# Patient Record
Sex: Male | Born: 1970 | Race: Black or African American | Marital: Single
Health system: Southern US, Community
[De-identification: ages and names within clinical notes are randomized; demographics above are authoritative.]

## PROBLEM LIST (undated history)

## (undated) DIAGNOSIS — F32A Depression, unspecified: Secondary | ICD-10-CM

## (undated) DIAGNOSIS — F319 Bipolar disorder, unspecified: Secondary | ICD-10-CM

## (undated) DIAGNOSIS — K219 Gastro-esophageal reflux disease without esophagitis: Secondary | ICD-10-CM

## (undated) DIAGNOSIS — A6000 Herpesviral infection of urogenital system, unspecified: Secondary | ICD-10-CM

## (undated) DIAGNOSIS — Z8659 Personal history of other mental and behavioral disorders: Secondary | ICD-10-CM

## (undated) DIAGNOSIS — F1910 Other psychoactive substance abuse, uncomplicated: Secondary | ICD-10-CM

## (undated) DIAGNOSIS — F329 Major depressive disorder, single episode, unspecified: Secondary | ICD-10-CM

## (undated) DIAGNOSIS — F1491 Cocaine use, unspecified, in remission: Secondary | ICD-10-CM

## (undated) HISTORY — PX: NECK SURGERY: SHX720

---

## 2004-09-30 ENCOUNTER — Emergency Department: Payer: Self-pay | Admitting: Emergency Medicine

## 2005-04-09 ENCOUNTER — Emergency Department: Payer: Self-pay | Admitting: Emergency Medicine

## 2007-08-22 ENCOUNTER — Emergency Department: Payer: Self-pay | Admitting: Internal Medicine

## 2012-12-09 ENCOUNTER — Emergency Department: Payer: Self-pay | Admitting: Emergency Medicine

## 2012-12-09 LAB — COMPREHENSIVE METABOLIC PANEL
Albumin: 3.9 g/dL (ref 3.4–5.0)
Anion Gap: 5 — ABNORMAL LOW (ref 7–16)
BUN: 11 mg/dL (ref 7–18)
Calcium, Total: 9.1 mg/dL (ref 8.5–10.1)
Creatinine: 1.37 mg/dL — ABNORMAL HIGH (ref 0.60–1.30)
EGFR (Non-African Amer.): >60
Glucose: 105 mg/dL — ABNORMAL HIGH (ref 65–99)
Potassium: 3.6 mmol/L (ref 3.5–5.1)
SGPT (ALT): 29 U/L (ref 12–78)
Sodium: 137 mmol/L (ref 136–145)

## 2012-12-09 LAB — URINALYSIS, COMPLETE
Bilirubin,UR: NEGATIVE
Blood: NEGATIVE
Glucose,UR: NEGATIVE mg/dL (ref 0–75)
Ketone: NEGATIVE
Nitrite: NEGATIVE
Ph: 6 (ref 4.5–8.0)
Protein: NEGATIVE
WBC UR: 2 /HPF (ref 0–5)

## 2012-12-09 LAB — CBC
HGB: 13.8 g/dL (ref 13.0–18.0)
MCH: 29.9 pg (ref 26.0–34.0)
MCHC: 33.2 g/dL (ref 32.0–36.0)
MCV: 90 fL (ref 80–100)
Platelet: 262 10*3/uL (ref 150–440)
RBC: 4.6 10*6/uL (ref 4.40–5.90)

## 2012-12-09 LAB — TROPONIN I: Troponin-I: <0.02 ng/mL

## 2013-09-12 ENCOUNTER — Emergency Department: Payer: Self-pay | Admitting: Emergency Medicine

## 2013-09-12 LAB — COMPREHENSIVE METABOLIC PANEL
ALBUMIN: 4.3 g/dL (ref 3.4–5.0)
AST: 43 U/L — AB (ref 15–37)
Alkaline Phosphatase: 67 U/L
Anion Gap: 11 (ref 7–16)
BILIRUBIN TOTAL: 0.8 mg/dL (ref 0.2–1.0)
BUN: 11 mg/dL (ref 7–18)
Calcium, Total: 9.4 mg/dL (ref 8.5–10.1)
Chloride: 104 mmol/L (ref 98–107)
Co2: 22 mmol/L (ref 21–32)
Creatinine: 1.65 mg/dL — ABNORMAL HIGH (ref 0.60–1.30)
EGFR (African American): 58 — ABNORMAL LOW
EGFR (Non-African Amer.): 50 — ABNORMAL LOW
GLUCOSE: 78 mg/dL (ref 65–99)
OSMOLALITY: 272 (ref 275–301)
Potassium: 3.8 mmol/L (ref 3.5–5.1)
SGPT (ALT): 19 U/L (ref 12–78)
Sodium: 137 mmol/L (ref 136–145)
Total Protein: 8.1 g/dL (ref 6.4–8.2)

## 2013-09-12 LAB — CBC
HCT: 44.6 % (ref 40.0–52.0)
HGB: 14.4 g/dL (ref 13.0–18.0)
MCH: 29.4 pg (ref 26.0–34.0)
MCHC: 32.2 g/dL (ref 32.0–36.0)
MCV: 91 fL (ref 80–100)
Platelet: 250 10*3/uL (ref 150–440)
RBC: 4.89 10*6/uL (ref 4.40–5.90)
RDW: 13.5 % (ref 11.5–14.5)
WBC: 9.5 10*3/uL (ref 3.8–10.6)

## 2013-09-12 LAB — ETHANOL
Ethanol %: 0.234 % — ABNORMAL HIGH (ref 0.000–0.080)
Ethanol: 234 mg/dL

## 2013-09-12 LAB — SALICYLATE LEVEL: Salicylates, Serum: <1.7 mg/dL

## 2013-09-12 LAB — ACETAMINOPHEN LEVEL: Acetaminophen: <2 ug/mL

## 2013-09-13 LAB — URINALYSIS, COMPLETE
BACTERIA: NONE SEEN
Bilirubin,UR: NEGATIVE
GLUCOSE, UR: NEGATIVE mg/dL (ref 0–75)
Hyaline Cast: 4
KETONE: NEGATIVE
Leukocyte Esterase: NEGATIVE
NITRITE: NEGATIVE
PH: 5 (ref 4.5–8.0)
Protein: 30
Specific Gravity: 1.008 (ref 1.003–1.030)
WBC UR: 1 /HPF (ref 0–5)

## 2013-09-13 LAB — DRUG SCREEN, URINE
AMPHETAMINES, UR SCREEN: NEGATIVE (ref ?–1000)
Barbiturates, Ur Screen: NEGATIVE (ref ?–200)
Benzodiazepine, Ur Scrn: NEGATIVE (ref ?–200)
CANNABINOID 50 NG, UR ~~LOC~~: NEGATIVE (ref ?–50)
COCAINE METABOLITE, UR ~~LOC~~: POSITIVE (ref ?–300)
MDMA (ECSTASY) UR SCREEN: NEGATIVE (ref ?–500)
METHADONE, UR SCREEN: NEGATIVE (ref ?–300)
OPIATE, UR SCREEN: NEGATIVE (ref ?–300)
Phencyclidine (PCP) Ur S: NEGATIVE (ref ?–25)
TRICYCLIC, UR SCREEN: NEGATIVE (ref ?–1000)

## 2013-10-02 ENCOUNTER — Emergency Department: Payer: Self-pay

## 2013-10-02 LAB — URINALYSIS, COMPLETE
BLOOD: NEGATIVE
Bilirubin,UR: NEGATIVE
Glucose,UR: NEGATIVE mg/dL (ref 0–75)
Ketone: NEGATIVE
LEUKOCYTE ESTERASE: NEGATIVE
NITRITE: NEGATIVE
PH: 5 (ref 4.5–8.0)
PROTEIN: NEGATIVE
SPECIFIC GRAVITY: 1.026 (ref 1.003–1.030)
WBC UR: 1 /HPF (ref 0–5)

## 2013-10-02 LAB — DRUG SCREEN, URINE
AMPHETAMINES, UR SCREEN: NEGATIVE (ref ?–1000)
Barbiturates, Ur Screen: NEGATIVE (ref ?–200)
Benzodiazepine, Ur Scrn: NEGATIVE (ref ?–200)
CANNABINOID 50 NG, UR ~~LOC~~: POSITIVE (ref ?–50)
Cocaine Metabolite,Ur ~~LOC~~: POSITIVE (ref ?–300)
MDMA (ECSTASY) UR SCREEN: NEGATIVE (ref ?–500)
METHADONE, UR SCREEN: NEGATIVE (ref ?–300)
Opiate, Ur Screen: NEGATIVE (ref ?–300)
Phencyclidine (PCP) Ur S: NEGATIVE (ref ?–25)
TRICYCLIC, UR SCREEN: NEGATIVE (ref ?–1000)

## 2013-10-02 LAB — CBC
HCT: 43.6 % (ref 40.0–52.0)
HGB: 14.1 g/dL (ref 13.0–18.0)
MCH: 29.6 pg (ref 26.0–34.0)
MCHC: 32.3 g/dL (ref 32.0–36.0)
MCV: 91 fL (ref 80–100)
Platelet: 303 10*3/uL (ref 150–440)
RBC: 4.77 10*6/uL (ref 4.40–5.90)
RDW: 14.1 % (ref 11.5–14.5)
WBC: 6.5 10*3/uL (ref 3.8–10.6)

## 2013-10-02 LAB — COMPREHENSIVE METABOLIC PANEL
ALK PHOS: 65 U/L
ANION GAP: 4 — AB (ref 7–16)
Albumin: 3.9 g/dL (ref 3.4–5.0)
BILIRUBIN TOTAL: 0.7 mg/dL (ref 0.2–1.0)
BUN: 9 mg/dL (ref 7–18)
CALCIUM: 9 mg/dL (ref 8.5–10.1)
Chloride: 106 mmol/L (ref 98–107)
Co2: 30 mmol/L (ref 21–32)
Creatinine: 1.19 mg/dL (ref 0.60–1.30)
EGFR (Non-African Amer.): >60
Glucose: 79 mg/dL (ref 65–99)
OSMOLALITY: 277 (ref 275–301)
POTASSIUM: 3.7 mmol/L (ref 3.5–5.1)
SGOT(AST): 16 U/L (ref 15–37)
SGPT (ALT): 18 U/L (ref 12–78)
Sodium: 140 mmol/L (ref 136–145)
TOTAL PROTEIN: 7.8 g/dL (ref 6.4–8.2)

## 2013-10-02 LAB — ETHANOL: Ethanol %: <0.003 % (ref 0.000–0.080)

## 2013-10-02 LAB — SALICYLATE LEVEL: Salicylates, Serum: <1.7 mg/dL

## 2013-10-02 LAB — TROPONIN I: Troponin-I: <0.02 ng/mL

## 2013-10-02 LAB — ACETAMINOPHEN LEVEL: Acetaminophen: <2 ug/mL

## 2014-03-05 ENCOUNTER — Emergency Department: Payer: Self-pay | Admitting: Emergency Medicine

## 2014-08-18 IMAGING — CT CT HEAD WITHOUT CONTRAST
2 series · 14 of 30 positions shown, 16 images · non-contrast
Comparison: None

CLINICAL DATA: Possible seizure.  Shaking arms and legs.

EXAM:
CT HEAD WITHOUT CONTRAST
TECHNIQUE: Contiguous axial images were obtained from the base of the skull
through the vertex without contrast.

[Series 2: head wo · axial · 0.44mm/px · z∈[-6,+102]mm · 6 of 34 slices shown, 8 images]
[im 5/34  brain]
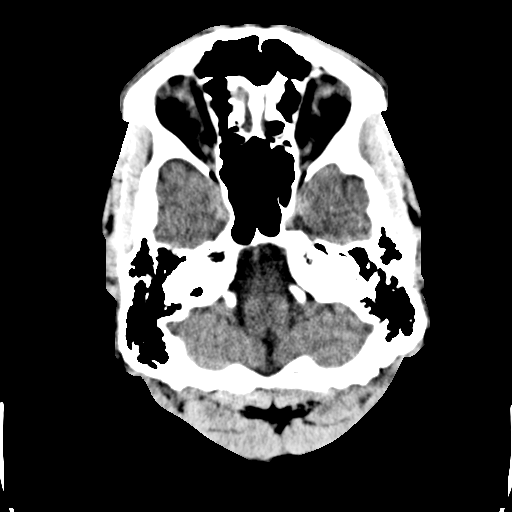
[im 5/34  bone]
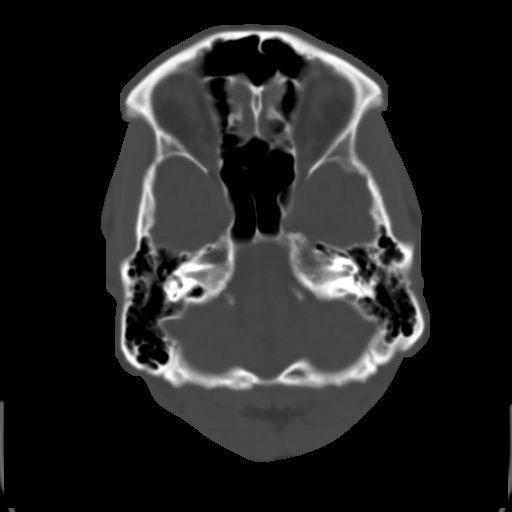
[im 10/34  brain]
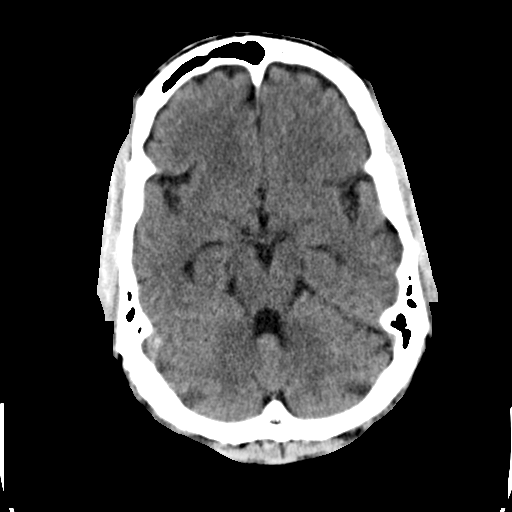
[im 15/34  brain]
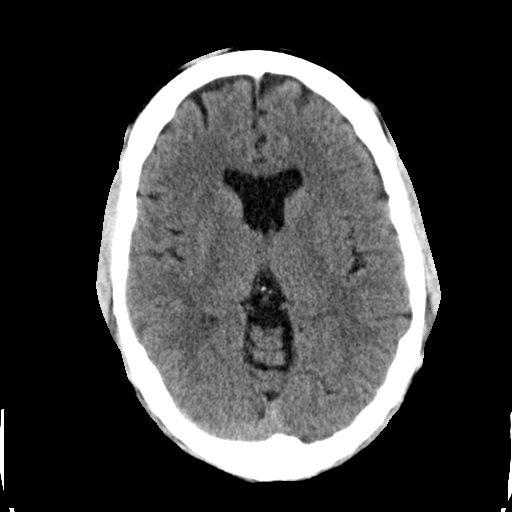
[im 19/34  brain]
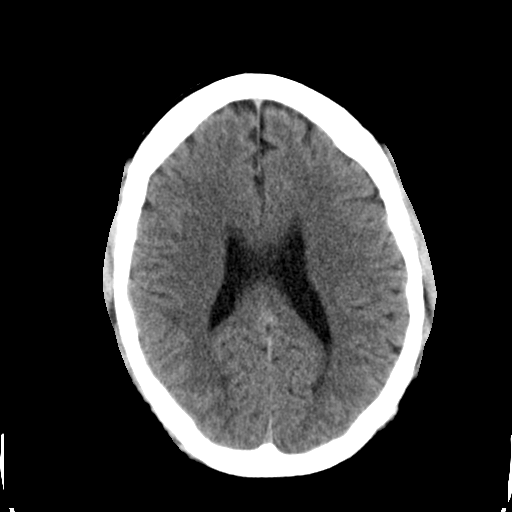
[im 24/34  brain]
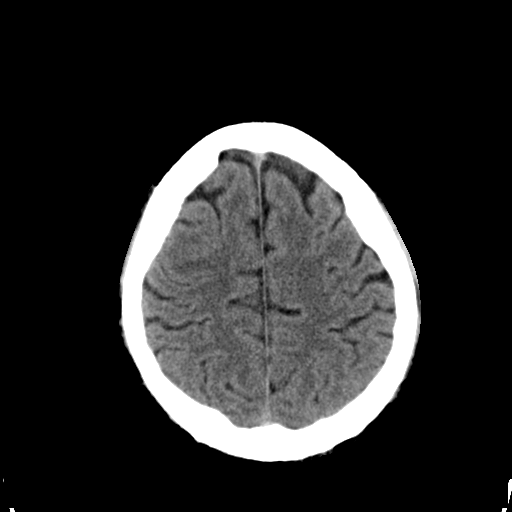
[im 24/34  bone]
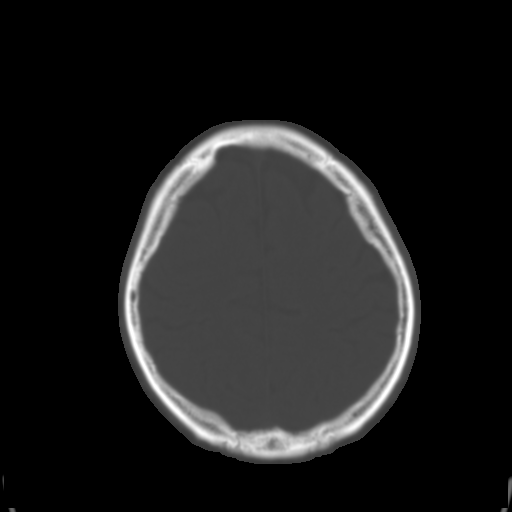
[im 29/34  brain]
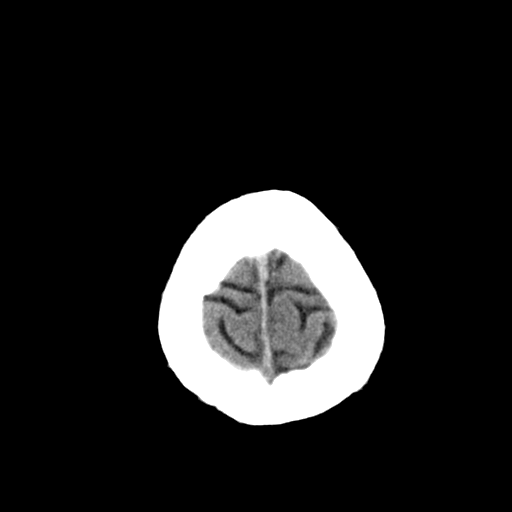

[Series 3: head bone · axial · 0.44mm/px · z∈[-12,+111]mm · 8 of 102 slices shown]
[im 10/102  bone]
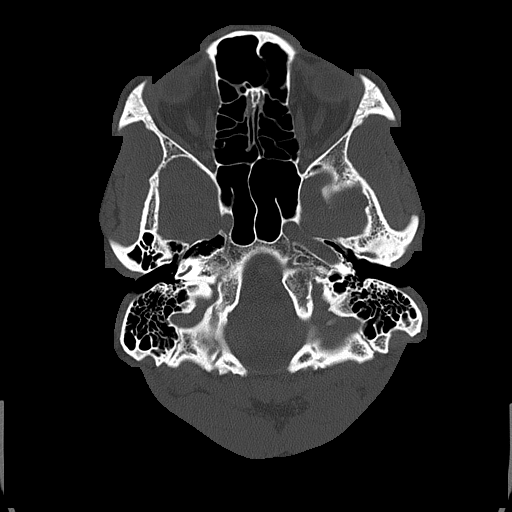
[im 20/102  bone]
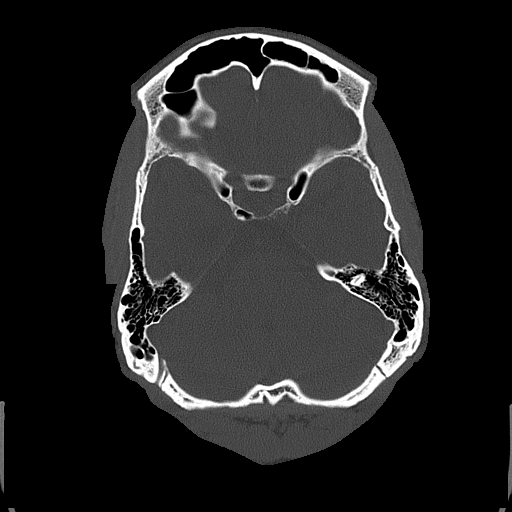
[im 34/102  bone]
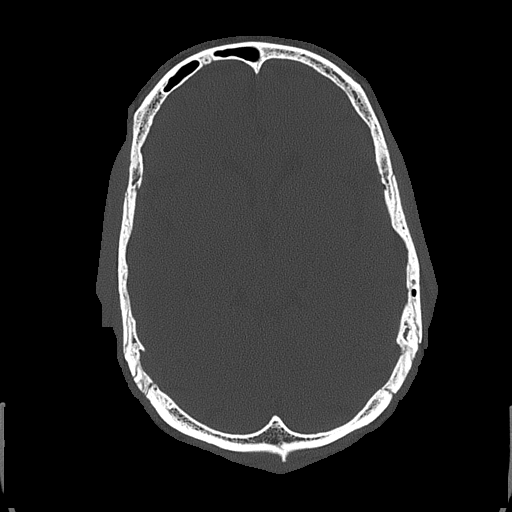
[im 44/102  bone]
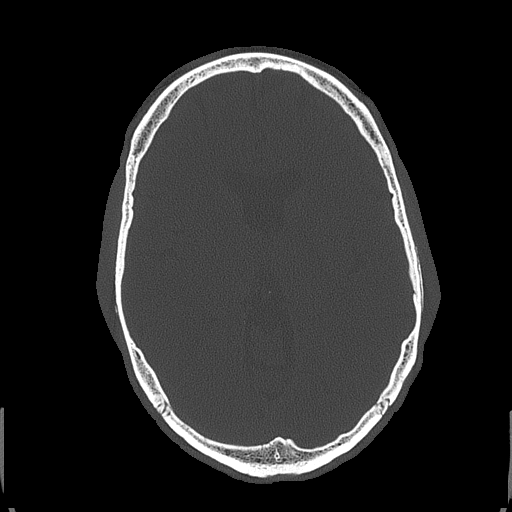
[im 58/102  bone]
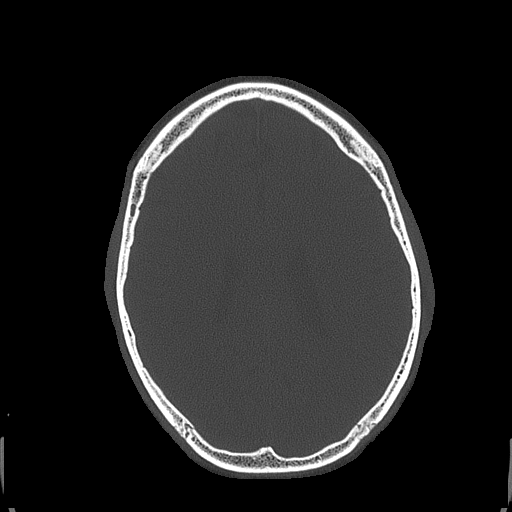
[im 68/102  bone]
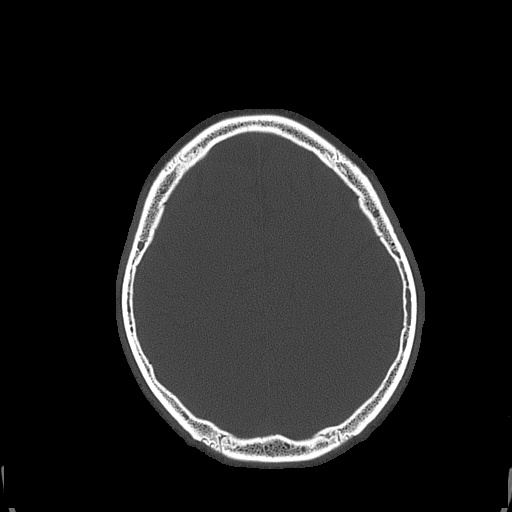
[im 82/102  bone]
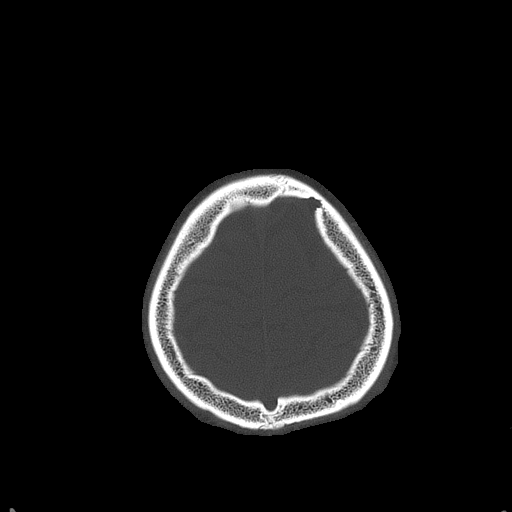
[im 92/102  bone]
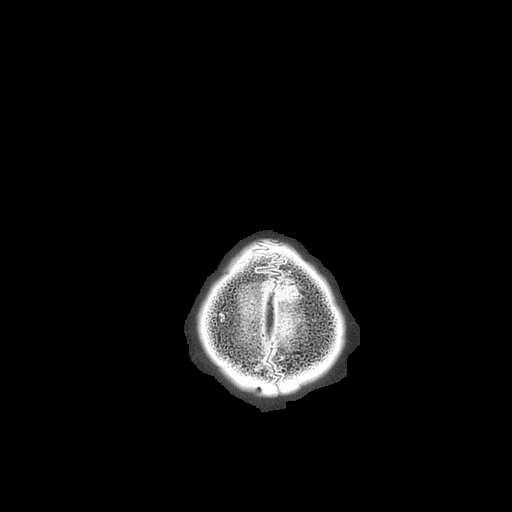

[14 of 30 positions shown; findings below may reference images not displayed]

FINDINGS: Normal appearance of the intracranial structures. No evidence for
acute hemorrhage, mass lesion, midline shift, hydrocephalus or large
infarct. No acute bony abnormality. The visualized sinuses are
clear. Incidental cavum septum pellucidum
IMPRESSION: Negative exam.

## 2015-01-15 ENCOUNTER — Encounter: Payer: Self-pay | Admitting: Emergency Medicine

## 2015-01-15 ENCOUNTER — Emergency Department
Admission: EM | Admit: 2015-01-15 | Discharge: 2015-01-17 | Disposition: A | Discharge status: Other Acute Inpatient Hospital | Attending: Emergency Medicine | Admitting: Emergency Medicine

## 2015-01-15 DIAGNOSIS — Z139 Encounter for screening, unspecified: Secondary | ICD-10-CM

## 2015-01-15 DIAGNOSIS — Z046 Encounter for general psychiatric examination, requested by authority: Secondary | ICD-10-CM | POA: Diagnosis present

## 2015-01-15 DIAGNOSIS — F1410 Cocaine abuse, uncomplicated: Secondary | ICD-10-CM | POA: Insufficient documentation

## 2015-01-15 DIAGNOSIS — F1910 Other psychoactive substance abuse, uncomplicated: Secondary | ICD-10-CM

## 2015-01-15 DIAGNOSIS — F319 Bipolar disorder, unspecified: Secondary | ICD-10-CM | POA: Diagnosis not present

## 2015-01-15 HISTORY — DX: Bipolar disorder, unspecified: F31.9

## 2015-01-15 LAB — CBC
HCT: 46.8 % (ref 40.0–52.0)
HEMOGLOBIN: 15.1 g/dL (ref 13.0–18.0)
MCH: 29.9 pg (ref 26.0–34.0)
MCHC: 32.3 g/dL (ref 32.0–36.0)
MCV: 92.8 fL (ref 80.0–100.0)
Platelets: 236 10*3/uL (ref 150–440)
RBC: 5.04 MIL/uL (ref 4.40–5.90)
RDW: 13.6 % (ref 11.5–14.5)
WBC: 6.9 10*3/uL (ref 3.8–10.6)

## 2015-01-15 LAB — COMPREHENSIVE METABOLIC PANEL
ALT: 12 U/L — AB (ref 17–63)
AST: 30 U/L (ref 15–41)
Albumin: 4.6 g/dL (ref 3.5–5.0)
Alkaline Phosphatase: 46 U/L (ref 38–126)
Anion gap: 4 — ABNORMAL LOW (ref 5–15)
BUN: 7 mg/dL (ref 6–20)
CALCIUM: 8.9 mg/dL (ref 8.9–10.3)
CO2: 27 mmol/L (ref 22–32)
CREATININE: 1.41 mg/dL — AB (ref 0.61–1.24)
Chloride: 110 mmol/L (ref 101–111)
GFR calc Af Amer: >60 mL/min (ref >60)
GFR calc non Af Amer: 59 mL/min — ABNORMAL LOW (ref >60)
GLUCOSE: 72 mg/dL (ref 65–99)
Potassium: 4.1 mmol/L (ref 3.5–5.1)
Sodium: 141 mmol/L (ref 135–145)
Total Bilirubin: 0.8 mg/dL (ref 0.3–1.2)
Total Protein: 8 g/dL (ref 6.5–8.1)

## 2015-01-15 LAB — URINE DRUG SCREEN, QUALITATIVE (ARMC ONLY)
Amphetamines, Ur Screen: NOT DETECTED
Barbiturates, Ur Screen: NOT DETECTED
Benzodiazepine, Ur Scrn: NOT DETECTED
CANNABINOID 50 NG, UR ~~LOC~~: NOT DETECTED
Cocaine Metabolite,Ur ~~LOC~~: POSITIVE — AB
MDMA (ECSTASY) UR SCREEN: NOT DETECTED
METHADONE SCREEN, URINE: NOT DETECTED
Opiate, Ur Screen: NOT DETECTED
PHENCYCLIDINE (PCP) UR S: NOT DETECTED
Tricyclic, Ur Screen: NOT DETECTED

## 2015-01-15 LAB — ETHANOL: Alcohol, Ethyl (B): 163 mg/dL — ABNORMAL HIGH (ref <5)

## 2015-01-15 MED ORDER — DIPHENHYDRAMINE HCL 50 MG/ML IJ SOLN
INTRAMUSCULAR | Status: AC
  Filled 2015-01-15: qty 1

## 2015-01-15 MED ORDER — HALOPERIDOL LACTATE 5 MG/ML IJ SOLN
5.0000 mg | Freq: Once | INTRAMUSCULAR | Status: AC
  Administered 2015-01-15: 5 mg via INTRAMUSCULAR

## 2015-01-15 MED ORDER — DIVALPROEX SODIUM 250 MG PO DR TAB
250.0000 mg | DELAYED_RELEASE_TABLET | Freq: Every day | ORAL | Status: DC
  Administered 2015-01-15 – 2015-01-16 (×2): 250 mg via ORAL
  Filled 2015-01-15 (×2): qty 1

## 2015-01-15 MED ORDER — DIPHENHYDRAMINE HCL 50 MG/ML IJ SOLN
50.0000 mg | Freq: Once | INTRAMUSCULAR | Status: AC
  Administered 2015-01-15: 50 mg via INTRAMUSCULAR

## 2015-01-15 MED ORDER — HALOPERIDOL LACTATE 5 MG/ML IJ SOLN
INTRAMUSCULAR | Status: AC
  Filled 2015-01-15: qty 1

## 2015-01-15 MED ORDER — LORAZEPAM 2 MG/ML IJ SOLN
2.0000 mg | Freq: Once | INTRAMUSCULAR | Status: AC
  Administered 2015-01-15: 2 mg via INTRAMUSCULAR

## 2015-01-15 MED ORDER — LORAZEPAM 2 MG/ML IJ SOLN
INTRAMUSCULAR | Status: AC
  Filled 2015-01-15: qty 1

## 2015-01-15 MED ORDER — RISPERIDONE 1 MG PO TABS
0.5000 mg | ORAL_TABLET | Freq: Every day | ORAL | Status: DC
  Administered 2015-01-15: 0.5 mg via ORAL
  Filled 2015-01-15: qty 1

## 2015-01-15 NOTE — ED Notes (Signed)
BEHAVIORAL HEALTH ROUNDING Patient sleeping: Yes.   Patient alert and oriented: not applicable Behavior appropriate: Yes.  ; If no, describe: n/a Nutrition and fluids offered: No Toileting and hygiene offered: No Sitter present: yes Law enforcement present: Yes  ENVIRONMENTAL ASSESSMENT Potentially harmful objects out of patient reach: Yes.   Personal belongings secured: Yes.   Patient dressed in hospital provided attire only: Yes.   Plastic bags out of patient reach: Yes.   Patient care equipment (cords, cables, call bells, lines, and drains) shortened, removed, or accounted for: Yes.   Equipment and supplies removed from bottom of stretcher: Yes.   Potentially toxic materials out of patient reach: Yes.   Sharps container removed or out of patient reach: Yes.      Patient in Forensic Restraints via [employer]. Distal neuro intact, Cap Refill <3sec, no visible bruising/wounds. 

## 2015-01-15 NOTE — ED Notes (Signed)
Patient assigned to appropriate care area. Patient oriented to unit/care area: Informed that, for their safety, care areas are designed for safety and monitored by security cameras at all times; and visiting hours explained to patient. Patient verbalizes understanding, and verbal contract for safety obtained. 

## 2015-01-15 NOTE — ED Notes (Signed)

## 2015-01-15 NOTE — ED Notes (Signed)
Pt transferred into ED BHU room 2   Patient assigned to appropriate care area. Patient oriented to unit/care area: Informed that, for their safety, care areas are designed for safety and monitored by security cameras at all times; Visiting hours and phone times explained to patient. Patient verbalizes understanding, and verbal contract for safety obtained.     

## 2015-01-15 NOTE — ED Provider Notes (Signed)
[facility] Emergency Department Provider Note  ____________________________________________  Time seen: 11:15 AM  I have reviewed the triage vital signs and the nursing notes.   HISTORY  Chief Complaint Medical Clearance     HPI Charles Garrison is a 44 y.o. male presents in [place] [employer] custody.Per police officers patient was taken to the emergency department for clearance for jail. Officers stated that the nurse at the jail stated that the patient's right pupil was not responding to light. Patient admits to smoking crack cocaine all morning. Patient repetitively voicing suicidal ideation stating "I'm been hang myself if you take me to jail". Patient denies any head injury or any trauma at all. Repetitively stating that "I'm bipolar i need  meds"      Past Medical History  Diagnosis Date  . Bipolar 1 disorder     There are no active problems to display for this patient.   History reviewed. No pertinent past surgical history.  No current outpatient prescriptions on file.  Allergies Review of patient's allergies indicates no known allergies.  History reviewed. No pertinent family history.  Social History History  Substance Use Topics  . Smoking status: Unknown If Ever Smoked  . Smokeless tobacco: Not on file  . Alcohol Use: Not on file    Review of Systems  Constitutional: Negative for fever. Eyes: Negative for visual changes. ENT: Negative for sore throat. Cardiovascular: Negative for chest pain. Respiratory: Negative for shortness of breath. Gastrointestinal: Negative for abdominal pain, vomiting and diarrhea. Genitourinary: Negative for dysuria. Musculoskeletal: Negative for back pain. Skin: Negative for rash. Neurological: Negative for headaches, focal weakness or numbness. Psychiatric:Suicidal Ideation  10-point ROS otherwise negative.  ____________________________________________   PHYSICAL EXAM:  VITAL SIGNS: ED Triage  Vitals  Enc Vitals Group     BP 01/15/15 1107 121/88 mmHg     Pulse Rate 01/15/15 1107 95     Resp 01/15/15 1107 19     Temp 01/15/15 1107 97.8 F (36.6 C)     Temp Source 01/15/15 1107 Oral     SpO2 01/15/15 1107 100 %     Weight 01/15/15 1107 230 lb (104.327 kg)     Height 01/15/15 1107 6\' 3"  (1.905 m)     Head Cir --      Peak Flow --      Pain Score --      Pain Loc --      Pain Edu? --      Excl. in GC? --      Constitutional: Very agitated and occasionally combative with officers Eyes: Conjunctivae are normal. PERRL. Normal extraocular movements. ENT   Head: Normocephalic and atraumatic.   Nose: No congestion/rhinnorhea.   Mouth/Throat: Mucous membranes are moist.   Neck: No stridor. Eyes: Normal bilateral pupillary response to light. Hematological/Lymphatic/Immunilogical: No cervical lymphadenopathy. Cardiovascular: Normal rate, regular rhythm. Normal and symmetric distal pulses are present in all extremities. No murmurs, rubs, or gallops. Respiratory: Normal respiratory effort without tachypnea nor retractions. Breath sounds are clear and equal bilaterally. No wheezes/rales/rhonchi. Gastrointestinal: Soft and nontender. No distention. There is no CVA tenderness. Genitourinary: deferred Musculoskeletal: Nontender with normal range of motion in all extremities. No joint effusions.  No lower extremity tenderness nor edema. Neurologic:  Normal speech and language. No gross focal neurologic deficits are appreciated. Speech is normal.  Skin:  Skin is warm, dry and intact. No rash noted. Psychiatric: Mood and affect are normal. Speech and behavior are normal. Patient exhibits appropriate insight  and judgment.  ____________________________________________    LABS (pertinent positives/negatives)    ____________________________________________   EKG    ____________________________________________     RADIOLOGY    ____________________________________________     INITIAL IMPRESSION / ASSESSMENT AND PLAN / ED COURSE  Pertinent labs & imaging results that were available during my care of the patient were reviewed by me and considered in my medical decision making (see chart for details).  Patient became acutely combative with [facility] police officers threatening to kill himself. As such patient was chemically sedated with Haldol Benadryl and Ativan for the safety of staff and himself.  ____________________________________________   FINAL CLINICAL IMPRESSION(S) / ED DIAGNOSES  Final diagnoses:  Encounter for medical screening examination      Darci Current, MD 01/17/15 2220

## 2015-01-15 NOTE — ED Notes (Addendum)
ENVIRONMENTAL ASSESSMENT  Potentially harmful objects out of patient reach: Yes.  Personal belongings secured: Yes.  Patient dressed in hospital provided attire only: Yes.  Plastic bags out of patient reach: Yes.  Patient care equipment (cords, cables, call bells, lines, and drains) shortened, removed, or accounted for: Yes.  Equipment and supplies removed from bottom of stretcher: Yes.  Potentially toxic materials out of patient reach: Yes.  Sharps container removed or out of patient reach: Yes.   BEHAVIORAL HEALTH ROUNDING  Patient sleeping: No.  Patient alert and oriented: yes  Behavior appropriate: Yes. ; If no, describe:  Nutrition and fluids offered: Yes  Toileting and hygiene offered: Yes  Sitter present: not applicable  Law enforcement present: Yes ODS  ED BHU PLACEMENT JUSTIFICATION  Is the patient under IVC or is there intent for IVC: Yes.  Is the patient medically cleared: Yes.  Is there vacancy in the ED BHU: Yes.  Is the population mix appropriate for patient: Yes.  Is the patient awaiting placement in inpatient or outpatient setting: Yes.  Has the patient had a psychiatric consult: Yes.  Survey of unit performed for contraband, proper placement and condition of furniture, tampering with fixtures in bathroom, shower, and each patient room: Yes. ; Findings: All clear  APPEARANCE/BEHAVIOR  calm, cooperative and adequate rapport can be established  NEURO ASSESSMENT  Orientation: time, place and person  Hallucinations: No.None noted (Hallucinations)  Speech: Normal  Gait: normal  RESPIRATORY ASSESSMENT  WNL  CARDIOVASCULAR ASSESSMENT  WNL  GASTROINTESTINAL ASSESSMENT  WNL  EXTREMITIES  WNL  PLAN OF CARE  Provide calm/safe environment. Vital signs assessed twice daily. ED BHU Assessment once each 12-hour shift. Collaborate with intake RN daily or as condition indicates. Assure the ED provider has rounded once each shift. Provide and encourage hygiene. Provide  redirection as needed. Assess for escalating behavior; address immediately and inform ED provider.  Assess family dynamic and appropriateness for visitation as needed: Yes. ; If necessary, describe findings:  Educate the patient/family about BHU procedures/visitation: Yes. ; If necessary, describe findings: Pt is calm and cooperative at this time. Pt understanding and accepting of unit procedures/rules. Will continue to monitor.  

## 2015-01-15 NOTE — ED Notes (Signed)
Patient in Midwife via [employer]. Distal neuro intact, Cap Refill <3sec, no visible bruising/wounds.

## 2015-01-15 NOTE — ED Notes (Signed)
BEHAVIORAL HEALTH ROUNDING Patient sleeping: Yes.   Patient alert and oriented: not applicable SLEEPING Behavior appropriate: Yes.  ; If no, describe: SLEEPING Nutrition and fluids offered: No SLEEPING Toileting and hygiene offered: NoSLEEPING Sitter present: not applicable Law enforcement present: Yes ODS 

## 2015-01-15 NOTE — ED Notes (Signed)
BEHAVIORAL HEALTH ROUNDING Patient sleeping: Yes.   Patient alert and oriented: not applicable Behavior appropriate: Yes.  ; If no, describe:  Nutrition and fluids offered: No Toileting and hygiene offered: No Sitter present: not applicable Law enforcement present: Yes  

## 2015-01-15 NOTE — ED Notes (Signed)
Patient in Forensic Restraints via [employer]. Distal neuro intact, Cap Refill <3sec, no visible bruising/wounds. 

## 2015-01-15 NOTE — ED Notes (Signed)
BEHAVIORAL HEALTH ROUNDING Patient sleeping: Yes.   Patient alert and oriented: not applicable Behavior appropriate: Yes.  ; If no, describe: n/a Nutrition and fluids offered: No Toileting and hygiene offered: No Sitter present: yes Law enforcement present: Yes  ENVIRONMENTAL ASSESSMENT Potentially harmful objects out of patient reach: Yes.   Personal belongings secured: Yes.   Patient dressed in hospital provided attire only: Yes.   Plastic bags out of patient reach: Yes.   Patient care equipment (cords, cables, call bells, lines, and drains) shortened, removed, or accounted for: Yes.   Equipment and supplies removed from bottom of stretcher: Yes.   Potentially toxic materials out of patient reach: Yes.   Sharps container removed or out of patient reach: Yes.      Patient in Midwife via [employer]. Distal neuro intact, Cap Refill <3sec, no visible bruising/wounds.

## 2015-01-15 NOTE — ED Notes (Signed)
Patient to ED BOB [place] [employer] for jail clearance. Patient stated he "smoked crack all morning" Per officers, patient's pupils were not reacting in jail. Patient is alert and oriented. Patient is able to follow instructions and answer questions. Patient in NAD.

## 2015-01-15 NOTE — ED Notes (Signed)
Called SOC in at [phone]

## 2015-01-15 NOTE — ED Notes (Addendum)
BEHAVIORAL HEALTH ROUNDING Patient sleeping: No. Patient alert and oriented: yes Behavior appropriate: No.; If no, describe: n/a Nutrition and fluids offered: Yes  Toileting and hygiene offered: Yes  Sitter present: yes Law enforcement present: Yes   ENVIRONMENTAL ASSESSMENT Potentially harmful objects out of patient reach: Yes. Personal belongings secured: Yes. Patient dressed in hospital provided attire only: Yes.   Plastic bags out of patient reach: Yes.   Patient care equipment (cords, cables, call bells, lines, and drains) shortened, removed, or accounted for: Yes.   Equipment and supplies removed from bottom of stretcher: Yes.   Potentially toxic materials out of patient reach: Yes.   Sharps container removed or out of patient reach: Yes.

## 2015-01-15 NOTE — ED Notes (Signed)
Patient denies pain and is resting comfortably.  

## 2015-01-15 NOTE — ED Notes (Addendum)
Patient acting out, attempting to get out of handcuffs after repeated redirected. Patient continuing to avoid instructions. Orders to medicate patient given by Manson Passey, MD. Patient in Police custody. Handcuffs present. Skin integrity intact. Pulses present. Capillary refill <3 sec. Respirations equal and unlabored.

## 2015-01-16 MED ORDER — RISPERIDONE 2 MG PO TBDP
2.0000 mg | ORAL_TABLET | Freq: Two times a day (BID) | ORAL | Status: DC
  Administered 2015-01-16 – 2015-01-17 (×2): 2 mg via ORAL
  Filled 2015-01-16 (×3): qty 1

## 2015-01-16 NOTE — ED Notes (Signed)
Pt awakened and told that he will have to eat, if he is going to, because, after the dinner hour, he is expected to bring all the trash in his room to the front.  Pt began eating his pizza.

## 2015-01-16 NOTE — ED Notes (Signed)
Pt has been to the bathroom a couple of times this shift, but most of the time, pt has been sleeping.  His breakfast and lunch remain in his room.  Pt is easily awakened, breathing unlabored.

## 2015-01-16 NOTE — ED Notes (Signed)
BEHAVIORAL HEALTH ROUNDING Patient sleeping: Yes.   Patient alert and oriented: not applicable SLEEPING Behavior appropriate: Yes.  ; If no, describe: SLEEPING Nutrition and fluids offered: No SLEEPING Toileting and hygiene offered: NoSLEEPING Sitter present: not applicable Law enforcement present: Yes ODS 

## 2015-01-16 NOTE — ED Notes (Signed)
BEHAVIORAL HEALTH ROUNDING Patient sleeping: Yes.   Patient alert and oriented: not applicable Behavior appropriate: Yes.  ; If no, describe:  Nutrition and fluids offered: Yes  Toileting and hygiene offered: Yes  Sitter present: not applicable Law enforcement present: Yes  

## 2015-01-16 NOTE — ED Notes (Signed)
BEHAVIORAL HEALTH ROUNDING Patient sleeping: Yes.   Patient alert and oriented: not applicable Behavior appropriate: Yes.  ; If no, describe:   Nutrition and fluids offered: No Toileting and hygiene offered: No Sitter present: no Law enforcement present: Yes  and ODS   ED BHU PLACEMENT JUSTIFICATION Is the patient under IVC or is there intent for IVC: Yes.   Is the patient medically cleared: Yes.   Is there vacancy in the ED BHU: Yes.   Is the population mix appropriate for patient: Yes.   Is the patient awaiting placement in inpatient or outpatient setting: Yes.   Has the patient had a psychiatric consult: Yes.   Survey of unit performed for contraband, proper placement and condition of furniture, tampering with fixtures in bathroom, shower, and each patient room: Yes.  ; Findings: all clear APPEARANCE/BEHAVIOR calm and cooperative NEURO ASSESSMENT Orientation: time, place and person Hallucinations: No.None noted (Hallucinations) Speech: Normal Gait: normal RESPIRATORY ASSESSMENT Normal expansion.  Clear to auscultation.  No rales, rhonchi, or wheezing. CARDIOVASCULAR ASSESSMENT regular rate and rhythm, S1, S2 normal, no murmur, click, rub or gallop GASTROINTESTINAL ASSESSMENT soft, nontender, BS WNL, no r/g EXTREMITIES normal strength, tone, and muscle mass PLAN OF CARE Provide calm/safe environment. Vital signs assessed twice daily. ED BHU Assessment once each 12-hour shift. Collaborate with intake RN daily or as condition indicates. Assure the ED provider has rounded once each shift. Provide and encourage hygiene. Provide redirection as needed. Assess for escalating behavior; address immediately and inform ED provider.  Assess family dynamic and appropriateness for visitation as needed: No.; If necessary, describe findings: not observed Educate the patient/family about BHU procedures/visitation: No.; If necessary, describe findings: NA  

## 2015-01-16 NOTE — ED Notes (Signed)
Patient remains on IVC/Consult complete/
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
## 2015-01-16 NOTE — ED Notes (Signed)
BEHAVIORAL HEALTH ROUNDING Patient sleeping: Yes.   Patient alert and oriented: not applicable Behavior appropriate: Yes.  ; If no, describe:  Nutrition and fluids offered: No Toileting and hygiene offered: No Sitter present: not applicable Law enforcement present: Yes  

## 2015-01-16 NOTE — ED Notes (Signed)
Pt awakened for breakfast, but did not eat yet and has returned to sleep.

## 2015-01-16 NOTE — ED Notes (Signed)
Dinner served.  Pt had dinner put on the chair with his other two meals.  He has returned to sleep.

## 2015-01-16 NOTE — ED Notes (Signed)
BEHAVIORAL HEALTH ROUNDING Patient sleeping: No. Patient alert and oriented: yes Behavior appropriate: Yes.  ; If no, describe:  Nutrition and fluids offered: Yes  Toileting and hygiene offered: Yes  Sitter present: not applicable Law enforcement present: Yes  

## 2015-01-16 NOTE — ED Notes (Signed)
Pt agreed to shower, took supplies, and went into shower; however, when he emerged, the soap was unopened and the towels were dry.  When asked if he had showered, pt said "yes".

## 2015-01-16 NOTE — ED Notes (Addendum)
BEHAVIORAL HEALTH ROUNDING Patient sleeping: Yes.   Patient alert and oriented: not applicable SLEEPING Behavior appropriate: Yes.  ; If no, describe: SLEEPING Nutrition and fluids offered: No SLEEPING Toileting and hygiene offered: NoSLEEPING Sitter present: not applicable Law enforcement present: Yes ODS 

## 2015-01-16 NOTE — ED Notes (Signed)
ED BHU PLACEMENT JUSTIFICATION Is the patient under IVC or is there intent for IVC: Yes.   Is the patient medically cleared: Yes.   Is there vacancy in the ED BHU: Yes.   Is the population mix appropriate for patient: Yes.   Is the patient awaiting placement in inpatient or outpatient setting: Yes.   Has the patient had a psychiatric consult: Yes.   Survey of unit performed for contraband, proper placement and condition of furniture, tampering with fixtures in bathroom, shower, and each patient room: Yes.  ; Findings:  APPEARANCE/BEHAVIOR calm and cooperative NEURO ASSESSMENT Orientation: place and person Hallucinations: No.None noted (Hallucinations) Speech: Normal Gait: normal RESPIRATORY ASSESSMENT Normal expansion.  Clear to auscultation.  No rales, rhonchi, or wheezing. CARDIOVASCULAR ASSESSMENT regular rate and rhythm, S1, S2 normal, no murmur, click, rub or gallop GASTROINTESTINAL ASSESSMENT soft, nontender, BS WNL, no r/g EXTREMITIES normal strength, tone, and muscle mass PLAN OF CARE Provide calm/safe environment. Vital signs assessed twice daily. ED BHU Assessment once each 12-hour shift. Collaborate with intake RN daily or as condition indicates. Assure the ED provider has rounded once each shift. Provide and encourage hygiene. Provide redirection as needed. Assess for escalating behavior; address immediately and inform ED provider.  Assess family dynamic and appropriateness for visitation as needed: Yes.  ; If necessary, describe findings:  Educate the patient/family about BHU procedures/visitation: Yes.  ; If necessary, describe findings:  

## 2015-01-16 NOTE — ED Notes (Signed)

## 2015-01-16 NOTE — ED Notes (Signed)

## 2015-01-17 ENCOUNTER — Inpatient Hospital Stay
Admission: EM | Admit: 2015-01-17 | Discharge: 2015-01-20 | DRG: 881 | Disposition: A | Discharge status: Home / Self Care | Payer: MEDICAID | Source: Intra-hospital | Attending: Psychiatry | Admitting: Psychiatry

## 2015-01-17 DIAGNOSIS — F1410 Cocaine abuse, uncomplicated: Secondary | ICD-10-CM | POA: Diagnosis present

## 2015-01-17 DIAGNOSIS — A6000 Herpesviral infection of urogenital system, unspecified: Secondary | ICD-10-CM | POA: Diagnosis present

## 2015-01-17 DIAGNOSIS — F1220 Cannabis dependence, uncomplicated: Secondary | ICD-10-CM | POA: Diagnosis present

## 2015-01-17 DIAGNOSIS — F419 Anxiety disorder, unspecified: Secondary | ICD-10-CM | POA: Diagnosis present

## 2015-01-17 DIAGNOSIS — R440 Auditory hallucinations: Secondary | ICD-10-CM | POA: Diagnosis present

## 2015-01-17 DIAGNOSIS — Z79899 Other long term (current) drug therapy: Secondary | ICD-10-CM | POA: Diagnosis not present

## 2015-01-17 DIAGNOSIS — G4700 Insomnia, unspecified: Secondary | ICD-10-CM | POA: Diagnosis present

## 2015-01-17 DIAGNOSIS — F1020 Alcohol dependence, uncomplicated: Secondary | ICD-10-CM | POA: Diagnosis present

## 2015-01-17 DIAGNOSIS — R45851 Suicidal ideations: Secondary | ICD-10-CM | POA: Diagnosis present

## 2015-01-17 DIAGNOSIS — F319 Bipolar disorder, unspecified: Secondary | ICD-10-CM | POA: Diagnosis present

## 2015-01-17 DIAGNOSIS — F4321 Adjustment disorder with depressed mood: Secondary | ICD-10-CM | POA: Diagnosis present

## 2015-01-17 DIAGNOSIS — Z87891 Personal history of nicotine dependence: Secondary | ICD-10-CM | POA: Diagnosis not present

## 2015-01-17 DIAGNOSIS — F1590 Other stimulant use, unspecified, uncomplicated: Secondary | ICD-10-CM

## 2015-01-17 DIAGNOSIS — F3130 Bipolar disorder, current episode depressed, mild or moderate severity, unspecified: Secondary | ICD-10-CM

## 2015-01-17 DIAGNOSIS — Z915 Personal history of self-harm: Secondary | ICD-10-CM | POA: Diagnosis not present

## 2015-01-17 LAB — LIPID PANEL
Cholesterol: 237 mg/dL — ABNORMAL HIGH (ref 0–200)
HDL: 64 mg/dL (ref >40)
LDL CALC: 127 mg/dL — AB (ref 0–99)
Total CHOL/HDL Ratio: 3.7 RATIO
Triglycerides: 230 mg/dL — ABNORMAL HIGH (ref <150)
VLDL: 46 mg/dL — ABNORMAL HIGH (ref 0–40)

## 2015-01-17 MED ORDER — ACETAMINOPHEN 325 MG PO TABS: 650.0000 mg | ORAL_TABLET | Freq: Four times a day (QID) | ORAL | Status: DC | PRN

## 2015-01-17 MED ORDER — DIVALPROEX SODIUM 250 MG PO DR TAB
250.0000 mg | DELAYED_RELEASE_TABLET | Freq: Every day | ORAL | Status: DC
  Administered 2015-01-17: 250 mg via ORAL
  Filled 2015-01-17: qty 1

## 2015-01-17 MED ORDER — MAGNESIUM HYDROXIDE 400 MG/5ML PO SUSP: 30.0000 mL | Freq: Every day | ORAL | Status: DC | PRN

## 2015-01-17 MED ORDER — ALUM & MAG HYDROXIDE-SIMETH 200-200-20 MG/5ML PO SUSP: 30.0000 mL | ORAL | Status: DC | PRN

## 2015-01-17 MED ORDER — RISPERIDONE 1 MG PO TBDP
2.0000 mg | ORAL_TABLET | Freq: Two times a day (BID) | ORAL | Status: DC
  Administered 2015-01-17 – 2015-01-18 (×2): 2 mg via ORAL
  Filled 2015-01-17 (×2): qty 2

## 2015-01-17 NOTE — ED Notes (Signed)
BEHAVIORAL HEALTH ROUNDING Patient sleeping: No. Patient alert and oriented: yes Behavior appropriate: Yes.  ; If no, describe:  Nutrition and fluids offered: Yes  Toileting and hygiene offered: Yes  Sitter present: no Law enforcement present: Yes  

## 2015-01-17 NOTE — ED Provider Notes (Signed)
Patient accepted to the inpatient psychiatric service by Dr. Toni Amend. Medically stable.  Emily Filbert, MD 01/17/15 [phone]

## 2015-01-17 NOTE — ED Notes (Signed)
,  BEHAVIORAL HEALTH ROUNDING Patient sleeping: No. Patient alert and oriented: yes Behavior appropriate: Yes.  ; If no, describe:  Nutrition and fluids offered: Yes  Toileting and hygiene offered: Yes  Sitter present: no Law enforcement present: Yes   Awaiting magistrate to come and interview pt.

## 2015-01-17 NOTE — ED Notes (Signed)
BEHAVIORAL HEALTH ROUNDING Patient sleeping: Yes.   Patient alert and oriented: not applicable Behavior appropriate: Yes.  ; If no, describe:   Nutrition and fluids offered: No Toileting and hygiene offered: No Sitter present: no Law enforcement present: Yes  and ODS    
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
## 2015-01-17 NOTE — ED Notes (Addendum)
BEHAVIORAL HEALTH ROUNDING Patient sleeping: Yes.   Patient alert and oriented: not applicable Behavior appropriate: Yes.  ; If no, describe:   Nutrition and fluids offered: No Toileting and hygiene offered: No Sitter present: no Law enforcement present: Yes  and ODS    

## 2015-01-17 NOTE — ED Notes (Signed)
Per [employer], pt will not be seen by magistrate today. Magistrate requests that pt be revaluated by psych for further assessment of in pt tx or if pt can be released to go to jail.  edp  Notified of request.

## 2015-01-17 NOTE — ED Notes (Signed)
BEHAVIORAL HEALTH ROUNDING Patient sleeping: Yes.   Patient alert and oriented: yes Behavior appropriate: Yes.  ; If no, describe:  Nutrition and fluids offered: Yes  Toileting and hygiene offered: Yes  Sitter present: no Law enforcement present: Yes  

## 2015-01-17 NOTE — ED Notes (Signed)
BEHAVIORAL HEALTH ROUNDING Patient sleeping: No. Patient alert and oriented: yes Behavior appropriate: Yes.  ; If no, describe:  Nutrition and fluids offered: Yes  Toileting and hygiene offered: Yes  Sitter present: no Law enforcement present: Yes    New order for psych consult entered.
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
## 2015-01-17 NOTE — ED Notes (Signed)
BEHAVIORAL HEALTH ROUNDING Patient sleeping: No. Patient alert and oriented: yes Behavior appropriate: Yes.  ; If no, describe:  Nutrition and fluids offered: Yes  Toileting and hygiene offered: Yes  Sitter present: no Law enforcement present: Yes   Awaiting admission to be completed.

## 2015-01-17 NOTE — ED Notes (Signed)
[employer] to go through pt belongings to obtain original release date from jail. magristrate to come and interview pt today at app 10am.

## 2015-01-17 NOTE — ED Notes (Signed)
Psych consult completed, pt to be admitted for inpatient treatment, app 2-5 days. [employer] notified to make magistrate aware of pt status.

## 2015-01-17 NOTE — Progress Notes (Signed)
Patient searched with no contraband found

## 2015-01-18 DIAGNOSIS — F1590 Other stimulant use, unspecified, uncomplicated: Secondary | ICD-10-CM

## 2015-01-18 DIAGNOSIS — A6000 Herpesviral infection of urogenital system, unspecified: Secondary | ICD-10-CM

## 2015-01-18 DIAGNOSIS — F1220 Cannabis dependence, uncomplicated: Secondary | ICD-10-CM

## 2015-01-18 DIAGNOSIS — F1020 Alcohol dependence, uncomplicated: Secondary | ICD-10-CM

## 2015-01-18 DIAGNOSIS — F4321 Adjustment disorder with depressed mood: Secondary | ICD-10-CM

## 2015-01-18 LAB — HEMOGLOBIN A1C: Hgb A1c MFr Bld: 5.1 % (ref 4.0–6.0)

## 2015-01-18 MED ORDER — TRAZODONE HCL 50 MG PO TABS: 50.0000 mg | ORAL_TABLET | Freq: Every evening | ORAL | Status: DC | PRN

## 2015-01-18 MED ORDER — VALACYCLOVIR HCL 500 MG PO TABS
500.0000 mg | ORAL_TABLET | Freq: Every day | ORAL | Status: DC
  Administered 2015-01-18 – 2015-01-20 (×3): 500 mg via ORAL
  Filled 2015-01-18 (×3): qty 1

## 2015-01-18 MED ORDER — FLUOXETINE HCL 10 MG PO CAPS
10.0000 mg | ORAL_CAPSULE | Freq: Every day | ORAL | Status: DC
  Administered 2015-01-18 – 2015-01-20 (×3): 10 mg via ORAL
  Filled 2015-01-18 (×3): qty 1

## 2015-01-18 NOTE — BHH Suicide Risk Assessment (Signed)
[facility] Admission Suicide Risk Assessment   Nursing information obtained from:  Patient Demographic factors:  Male Current Mental Status:    Loss Factors:  Loss of significant relationship, Legal issues Historical Factors:  Prior suicide attempts, Family history of mental illness or substance abuse Risk Reduction Factors:  Responsible for children under 45 years of age, Employed, Living with another person, especially a relative, Sense of responsibility to family Total Time spent with patient: 1 hour Principal Problem: Adjustment disorder with depressed mood Diagnosis:   Patient Active Problem List   Diagnosis Date Noted  . Alcohol use disorder, severe, dependence [F10.20] 01/18/2015  . Stimulant use disorder (cocaine) [F15.99] 01/18/2015  . Cannabis use disorder, moderate, dependence [F12.20] 01/18/2015  . Genital herpes [A60.00] 01/18/2015  . Adjustment disorder with depressed mood [F43.21] 01/18/2015     Continued Clinical Symptoms:  Alcohol Use Disorder Identification Test Final Score (AUDIT): 1 The "Alcohol Use Disorders Identification Test", Guidelines for Use in Primary Care, Second Edition.  World Science writer [facility]). Score between 0-7:  no or low risk or alcohol related problems. Score between 8-15:  moderate risk of alcohol related problems. Score between 16-19:  high risk of alcohol related problems. Score 20 or above:  warrants further diagnostic evaluation for alcohol dependence and treatment.   CLINICAL FACTORS:   Depression:   Comorbid alcohol abuse/dependence Alcohol/Substance Abuse/Dependencies Previous Psychiatric Diagnoses and Treatments    Psychiatric Specialty Exam: Physical Exam  ROS    COGNITIVE FEATURES THAT CONTRIBUTE TO RISK:  Closed-mindedness    SUICIDE RISK:   Mild:  Suicidal ideation of limited frequency, intensity, duration, and specificity.  There are no identifiable plans, no associated intent, mild dysphoria and related symptoms, good  self-control (both objective and subjective assessment), few other risk factors, and identifiable protective factors, including available and accessible social support.  PLAN OF CARE: admit to [facility]  Medical Decision Making:  Established Problem, Worsening (2)  I certify that inpatient services furnished can reasonably be expected to improve the patient's condition.   Charles Garrison 01/18/2015, 2:20 PM
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
## 2015-01-18 NOTE — Plan of Care (Signed)
Problem: Alteration in mood Goal: LTG-Patient reports reduction in suicidal thoughts (Patient reports reduction in suicidal thoughts and is able to verbalize a safety plan for whenever patient is feeling suicidal)  Outcome: Progressing Denies suicidal ideations     

## 2015-01-18 NOTE — Progress Notes (Signed)
D: Patient stated slept fair last night .Stated appetite is fair and energy level  Is normal. Stated concentration is good . Stated on Depression scale 0, hopeless 0 and anxiety0 . ( low 0-10 high) Denies suicidal  homicidal ideations  .  No auditory hallucinations  No pain concerns . Appropriate ADL'S. Interacting with peers and staff.  A: Encourage patient participation with unit programming . Instruction  Given on  Medication , verbalize understanding.Instructed on Treatment Team  Aware of length Of Stay R: Voice no other concerns. Staff continue to monitor

## 2015-01-18 NOTE — Tx Team (Signed)
Initial Interdisciplinary Treatment Plan   PATIENT STRESSORS: Legal issue Marital or family conflict   PATIENT STRENGTHS: Ability for insight Financial means General fund of knowledge   PROBLEM LIST: Problem List/Patient Goals Date to be addressed Date deferred Reason deferred Estimated date of resolution  Depression 01/17/15     Suicidal Ideation 01/17/15                                                DISCHARGE CRITERIA:  Improved stabilization in mood, thinking, and/or behavior  PRELIMINARY DISCHARGE PLAN: Outpatient therapy  PATIENT/FAMIILY INVOLVEMENT: This treatment plan has been presented to and reviewed with the patient, JAQUORI HUNSINGER, and/or family member.  The patient and family have been given the opportunity to ask questions and make suggestions.  Gretta [facility] 01/18/2015, 1:23 AM

## 2015-01-18 NOTE — Progress Notes (Signed)
Recreation Therapy Notes  Date: 06.21.16 Time: 3:00 pm Location: Craft Room  Group Topic: Goal Setting   Goal Area(s) Addresses:  Patient will be able to identify one goal. Patient will verbalize benefit of setting goals. Patient will be able to identify at least one positive statement.  Behavioral Response: Attentive  Intervention: Step By Step  Activity: Patients were given a worksheet with a foot on it. Patients were instructed to write a goal on the inside of the foot and to write positive statements/advice on the outside of the foot.  Education: LRT educated patients on healthy ways to celebrate achieving their goals.   Education Outcome: Acknowledges education/In group clarification offered  Clinical Observations/Feedback: Patient completed activity by listing a goal and some positive words. Patient did not contribute to group discussion.  Jacquelynn Cree, LRT/CTRS 01/18/2015 4:06 PM
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
## 2015-01-18 NOTE — Plan of Care (Signed)
Problem: Diagnosis: Increased Risk For Suicide Attempt Goal: LTG-Patient Will Show Positive Response to Medication LTG (by discharge) : Patient will show positive response to medication and will participate in the development of the discharge plan.  Outcome: Progressing Instruction given on prozac, verbalize  Understanding .

## 2015-01-18 NOTE — Progress Notes (Signed)
Recreation Therapy Notes  INPATIENT RECREATION THERAPY ASSESSMENT  Patient Details Name: Charles Garrison MRN: [id number] DOB: 1971/06/06 Today's Date: 01/18/2015  Patient Stressors: Relationship, Work (lack of work)  Coping Skills:   Isolate, Arguments, Substance Abuse, Avoidance, Exercise, Art/Dance, Sports, Music, Talking  Personal Challenges: Stress Management, Anger, Communication, Concentration, Decision-Making, Problem-Solving, Relationships, Self-Esteem/Confidence, Social Interaction, Substance Abuse, Time Management, Trusting Others  Leisure Interests (2+):  Individual - Other (Comment) (Watch sports, dance)  Awareness of Community Resources:  Yes  Community Resources:  [place], [place]  Current Use: Yes  If no, Barriers?:    Patient Strengths:  Alive, trying to get himself back on the right track  Patient Identified Areas of Improvement:  Stress and bipolar  Current Recreation Participation:  Nothing  Patient Goal for Hospitalization:  To get back right  [place] of Residence:  [place] of Residence:  [place]   Current SI (including self-harm):  Yes  Current HI:  No  Consent to Intern Participation: N/A   Jacquelynn Cree, LRT/CTRS 01/18/2015, 1:43 PM

## 2015-01-18 NOTE — [facility] Group Notes (Signed)
[facility] Group Notes:  (Nursing/MHT/Case Management/Adjunct)  Date:  01/18/2015  Time:  3:11 PM  Type of Therapy:  Psychoeducational Skills  Participation Level:  Minimal  Participation Quality:  Appropriate  Affect:  Flat  Cognitive:  Appropriate  Insight:  Improving  Engagement in Group:  None  Modes of Intervention:  Support  Summary of Progress/Problems:  Charles Garrison 01/18/2015, 3:11 PM
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
## 2015-01-18 NOTE — Clinical Social Work Note (Signed)
CSW attempted to contact pt's friend Georgetta Haber [phone] in order to provide suicide education .  CSW left a voicemail message requesting a return call.  CSW will continue to follow and attempt to contact pt's friend.    [place], [place] [phone]

## 2015-01-18 NOTE — [facility] Group Notes (Signed)
[facility] Group Notes:  (Nursing/MHT/Case Management/Adjunct)  Date:  01/18/2015  Time:  10:26 PM  Type of Therapy:  Group Therapy  Participation Level:  Active  Participation Quality:  Appropriate  Affect:  Appropriate  Cognitive:  Alert  Insight:  Appropriate  Engagement in Group:  Engaged  Modes of Intervention:  n/a  Summary of Progress/Problems:  Charles Garrison 01/18/2015, 10:26 PM

## 2015-01-19 MED ORDER — HYDROXYZINE HCL 50 MG PO TABS
50.0000 mg | ORAL_TABLET | Freq: Three times a day (TID) | ORAL | Status: DC
  Administered 2015-01-19 – 2015-01-20 (×3): 50 mg via ORAL
  Filled 2015-01-19 (×3): qty 1

## 2015-01-19 NOTE — Plan of Care (Signed)
Problem: [facility] Participation in Recreation Therapeutic Interventions Goal: STG-Patient will demonstrate improved self esteem by identif STG: Self-Esteem - Within 5 treatment sessions, patient will verbalize at least 5 positive affirmation statements in each of 3 treatment sessions to increase self-esteem post d/c.  Outcome: Progressing Treatment Session 1; Completed 1 out of 3: At approximately 11:20 am, LRT met with patient in craft room. Patient verbalized 5 positive affirmation statements. Patient reported it felt "pretty good". LRT encouraged patient to continue saying positive affirmation statements.  Leonette Monarch, LRT/CTRS 06.22.16 1:39 pm Goal: STG-Patient will identify at least five coping skills for ** STG: Coping Skills - Within 4 treatment sessions, patient will verbalize at least 5 coping skills for substance abuse in each of 2 treatment sessions to decrease substance abuse post d/c.  Outcome: Progressing Treatment Session 1; Completed 1 out of 1: At approximately 11:20 am, LRT met with patient in craft room. Patient verbalized 5 coping skills for substance abuse. LRT educated patient on leisure and why it is important to implement it into his schedule. LRT provided patient with blank schedules to help him plan his day and try avoid using substances. Patient verbalized understanding.  Leonette Monarch, LRT/CTRS 06.22.16 1:44 pm Goal: STG-Other Recreation Therapy Goal (Specify) STG: Stress Management - Within 5 treatment sessions, patient will demonstrate at least one stress management technique in each of 2 treatment sessions to increase stress management skills post d/c.  Outcome: Progressing Treatment Session 1; Completed 0 out of 2: At approximately 11:20 am, LRT met with patient in craft room. LRT educated and provided patient with handouts on stress management techniques. Patient verbalized understanding. LRT encouraged patient to read over and practice the stress management  techniques.  Leonette Monarch, LRT/CTRS 06.22.16 1:46 pm
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
## 2015-01-19 NOTE — [facility] Group Notes (Signed)
[facility] Group Notes:  (Nursing/MHT/Case Management/Adjunct)  Date:  01/19/2015  Time:  11:44 AM  Type of Therapy:  Psychoeducational Skills  Participation Level:  None left early     Charles Garrison 01/19/2015, 11:44 AM

## 2015-01-19 NOTE — Plan of Care (Signed)
Problem: Ineffective individual coping Goal: LTG: Patient will report a decrease in negative feelings Outcome: Progressing Verbalize use of coping skills

## 2015-01-19 NOTE — Progress Notes (Signed)
D: Patient denies SI/HI/AVH. Patient affect is flat. Mood is depressed.  Patient did attend evening group. Patient visible on the milieu. No distress noted. A: Support and encouragement offered. Scheduled medications given to pt. Q 15 min checks continued for patient safety. R: Patient receptive. Patient remains safe on the unit.   

## 2015-01-19 NOTE — Progress Notes (Addendum)
D: Patient stated slept fair last night .Stated appetite is fair and energy level  Is normal. Stated concentration is good . Stated on Depression scale 0, hopeless0 and anxiety 0 .( low 0-10 high) Denies suicidal  homicidal ideations .  No auditory hallucinations  No pain concerns . Appropriate ADL'S. Interacting with peers and staff. Encourage patient to work on Pharmacologist around his anger. Stated he was going to apologize  To his ex girl friend. Noted to blame  A: Encourage patient participation with unit programming . Instruction  Given on  Medication , verbalize understanding. Encourage patient to see his part in the situation. R: Voice no other concerns. Staff continue to monitor

## 2015-01-19 NOTE — Progress Notes (Signed)
Recreation Therapy Notes  Date: 06.22.16 Time: 3:00 pm Location: Craft Room  Group Topic: Self-esteem  Goal Area(s) Addresses:  Patient will be able to identify benefit of self-esteem. Patient will be able to identify ways to increase self-esteem.  Behavioral Response: Attentive, Interactive  Intervention: Self-Portrait  Activity: Patients were instructed to draw their self-portrait, write something positive about themselves and their peers, and draw their self-portrait after they read the positive things peers wrote.   Education:LRT educated patients on ways to increase their self-esteem.   Education Outcome: Acknowledges education/In group clarification offered  Clinical Observations/Feedback: Patient both self-portraits, wrote a positive trait about himself and his peers. Patient contributed to group discussion by stating how he felt after reading the positive comments from his peers.  Jacquelynn Cree, LRT/CTRS 01/19/2015 4:31 PM

## 2015-01-20 MED ORDER — FLUOXETINE HCL 10 MG PO CAPS: 10.0000 mg | ORAL_CAPSULE | Freq: Every day | ORAL | Status: DC

## 2015-01-20 MED ORDER — HYDROXYZINE HCL 50 MG PO TABS: 50.0000 mg | ORAL_TABLET | Freq: Three times a day (TID) | ORAL | Status: DC | PRN

## 2015-01-20 MED ORDER — TRAZODONE HCL 50 MG PO TABS: 50.0000 mg | ORAL_TABLET | Freq: Every evening | ORAL | Status: DC | PRN

## 2015-01-20 MED ORDER — VALACYCLOVIR HCL 500 MG PO TABS: 500.0000 mg | ORAL_TABLET | Freq: Every day | ORAL | Status: DC

## 2015-01-20 NOTE — [facility] Suicide Risk Assessment (Signed)
[facility] Discharge Suicide Risk Assessment   Demographic Factors:  Male  Total Time spent with patient: 30 minutes    Psychiatric Specialty Exam: Physical Exam  ROS  Have you used any form of tobacco in the last 30 days? (Cigarettes, Smokeless Tobacco, Cigars, and/or Pipes): No  Has this patient used any form of tobacco in the last 30 days? (Cigarettes, Smokeless Tobacco, Cigars, and/or Pipes) No  Mental Status Per Nursing Assessment::   On Admission:     Current Mental Status by Physician: denies SI, HI or A/VH.  Mood is much improved, no evidence of agitation or anxiety.  Sleeping and eating well.  Taking meds and reponding well.   Hopeful and future oriented., calm and cooperative. Supportive family.  Loss Factors: Legal issues  Historical Factors: Prior suicide attempts and Impulsivity  Risk Reduction Factors:   Responsible for children under 22 years of age, Sense of responsibility to family, Employed, Living with another person, especially a relative and Positive social support  Continued Clinical Symptoms:  Depression:   Comorbid alcohol abuse/dependence Impulsivity Alcohol/Substance Abuse/Dependencies Previous Psychiatric Diagnoses and Treatments  Cognitive Features That Contribute To Risk:  None    Suicide Risk:  Minimal: No identifiable suicidal ideation.  Patients presenting with no risk factors but with morbid ruminations; may be classified as minimal risk based on the severity of the depressive symptoms  Principal Problem: Adjustment disorder with depressed mood Discharge Diagnoses:  Patient Active Problem List   Diagnosis Date Noted  . Alcohol use disorder, severe, dependence [F10.20] 01/18/2015  . Stimulant use disorder (cocaine) [F15.99] 01/18/2015  . Cannabis use disorder, moderate, dependence [F12.20] 01/18/2015  . Genital herpes [A60.00] 01/18/2015  . Adjustment disorder with depressed mood [F43.21] 01/18/2015      Plan Of Care/Follow-up  recommendations:  Other:  f/u with outpt psychiatry  Is patient on multiple antipsychotic therapies at discharge:  No   Has Patient had three or more failed trials of antipsychotic monotherapy by history:  No  Recommended Plan for Multiple Antipsychotic Therapies: NA    Jimmy Footman 01/20/2015, 8:55 AM

## 2015-01-20 NOTE — Progress Notes (Signed)
Recreation Therapy Notes  INPATIENT RECREATION TR PLAN  Patient Details Name: Charles Garrison MRN: [id number] DOB: 11/02/1970 Today's Date: 01/20/2015  Rec Therapy Plan Is patient appropriate for Therapeutic Recreation?: Yes Treatment times per week: At least 3 times a week TR Treatment/Interventions: 1:1 session, Group participation (Comment) (Appropriate participation in daily recreation therapy tx)  Discharge Criteria Pt will be discharged from therapy if:: Discharged Treatment plan/goals/alternatives discussed and agreed upon by:: Patient/family  Discharge Summary Short term goals set: See Care Plan Short term goals met: Complete, Adequate for discharge Progress toward goals comments: One-to-one attended Which groups?: Self-esteem, Goal setting One-to-one attended: Self-esteem, stress management, coping skills Reason goals not met: Patient planning to d/c before goals could be met Therapeutic equipment acquired: None Reason patient discharged from therapy: Discharge from hospital Pt/family agrees with progress & goals achieved: Yes Date patient discharged from therapy: 01/20/15   Leonette Monarch, LRT/CTRS 01/20/2015, 12:58 PM

## 2015-01-20 NOTE — Plan of Care (Signed)
Problem: [facility] Participation in Recreation Therapeutic Interventions Goal: STG-Patient will demonstrate improved self esteem by identif STG: Self-Esteem - Within 5 treatment sessions, patient will verbalize at least 5 positive affirmation statements in each of 3 treatment sessions to increase self-esteem post d/c.  Outcome: Adequate for Discharge Treatment Session 2; Completed 2 out of 2: At approximately 9:50 am, LRT met with patient in patient room. Patient verbalized 5 positive affirmation statements. Patient reported it felt "pretty good" and "alright". LRT encouraged patient to continue saying positive affirmation statements.  Leonette Monarch, LRT/CTRS 06.23.16 12:51 pm Goal: STG-Patient will identify at least five coping skills for ** STG: Coping Skills - Within 4 treatment sessions, patient will verbalize at least 5 coping skills for substance abuse in each of 2 treatment sessions to decrease substance abuse post d/c.  Outcome: Completed/Met Date Met:  01/20/15 Treatment Session 2; Completed 2 out of 2: At approximately 9:50 am, LRT met with patient in patient room. Patient verbalized 5 coping skills for substance abuse. LRT encouraged patient to use his healthy coping skills.  Leonette Monarch, LRT/CTRS 06.23.16 12:53 pm Goal: STG-Other Recreation Therapy Goal (Specify) STG: Stress Management - Within 5 treatment sessions, patient will demonstrate at least one stress management technique in each of 2 treatment sessions to increase stress management skills post d/c.  Outcome: Adequate for Discharge Treatment Session 2; Completed 1 out of 2: At approximately 9:50 am, LRT met with patient in patient room. Patient reported he read over and practiced the stress management techniques. Patient demonstrated one techniques with some advice from LRT. LRT encouraged patient to continue practicing the stress management techniques.  Leonette Monarch, LRT/CTRS 06.23.16 12:56 pm

## 2015-01-20 NOTE — [facility] Suicide Risk Assessment (Signed)
[facility] INPATIENT:  Family/Significant Other Suicide Prevention Education  Suicide Prevention Education:  Education Completed: Charles Garrison [phone]) has been identified by the patient as the family member/significant other with whom the patient will be residing, and identified as the person(s) who will aid the patient in the event of a mental health crisis (suicidal ideations/suicide attempt).  With written consent from the patient, the family member/significant other has been provided the following suicide prevention education, prior to the and/or following the discharge of the patient.  The suicide prevention education provided includes the following:  Suicide risk factors  Suicide prevention and interventions  National Suicide Hotline telephone number  [facility] assessment telephone number  [facility] Emergency Assistance 911  [facility] and/or Residential Mobile Crisis Unit telephone number  Request made of family/significant other to:  Remove weapons (e.g., guns, rifles, knives), all items previously/currently identified as safety concern.    Remove drugs/medications (over-the-counter, prescriptions, illicit drugs), all items previously/currently identified as a safety concern.  The family member/significant other verbalizes understanding of the suicide prevention education information provided.  The family member/significant other agrees to remove the items of safety concern listed above.  Rondall Allegra, MSW, LCSWA  01/20/2015, 2:18 PM

## 2015-01-20 NOTE — Progress Notes (Signed)
D: Patient denies SI/HI/AVH.  Patient affect and mood are depressed.  Patient did attend evening group. Patient visible on the milieu. No distress noted. A: Support and encouragement offered. Scheduled medications given to pt. Q 15 min checks continued for patient safety. R: Patient receptive. Patient remains safe on the unit.   

## 2015-01-20 NOTE — [facility] Group Notes (Signed)
[facility] Group Notes:  (Nursing/MHT/Case Management/Adjunct)  Date:  01/20/2015  Time:  3:55 AM  Type of Therapy:  Psychoeducational Skills  Participation Level:  Active  Participation Quality:  Appropriate and Attentive  Affect:  Blunted and Flat  Cognitive:  Oriented  Insight:  Lacking and Limited  Engagement in Group:  Lacking and Limited  Modes of Intervention:  Discussion, Education and Exploration  Summary of Progress/Problems:  Charles Garrison 01/20/2015, 3:55 AM

## 2015-01-20 NOTE — Progress Notes (Signed)
Patient slept last evening fair. Did not need sleep aid - slept 7.3 hours. Appetite was good. Energy level was low. Concenteation was good. Depression level rated 0 , hopelessness rated 0, anxiety rated 0 : Based on scale of 0-10.  No experiencing  SI/Hi, nor Auditory/ Visual Hallucination. No complaints of pain.   Patient encouraged to express feelings. Patient attends groups, medication and meal compliant.    Patient goal is to keep head up and be strong to make a change.

## 2015-01-20 NOTE — [facility] Group Notes (Signed)
[facility] Group Notes:  (Nursing/MHT/Case Management/Adjunct)  Date:  01/20/2015  Time:  9:04 AM  Type of Therapy:  Community Meeting   Participation Level:  Minimal  Participation Quality:  Appropriate and Attentive  Affect:  Appropriate  Cognitive:  Alert, Appropriate and Oriented  Insight:  Appropriate  Engagement in Group:  Engaged  Modes of Intervention:  Discussion  Summary of Progress/Problems:  Charles Garrison De'Chelle Berna Gitto 01/20/2015, 9:04 AM
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
## 2015-01-20 NOTE — Progress Notes (Signed)
AVS H&P faxed to [facility] for hospital follow-up °

## 2015-01-20 NOTE — [facility] Group Notes (Signed)
[facility] Group Notes:  (Nursing/MHT/Case Management/Adjunct)  Date:  01/20/2015  Time:  3:44 PM  Type of Therapy:  Movement Therapy  Participation Level:  Did Not Attend  Summary of Progress/Problems:  Libertie Hausler De'Chelle Taneil Lazarus 01/20/2015, 3:44 PM

## 2015-01-20 NOTE — Tx Team (Signed)
Interdisciplinary Treatment Plan Update (Adult)  Date:  01/20/2015 Time Reviewed:  4:51 PM  Progress in Treatment: Attending groups: Yes. Participating in groups:  Yes. Taking medication as prescribed:  Yes. Tolerating medication:  Yes. Family/Significant othe contact made:  Yes, individual(s) contacted:   Georgetta Haber, Mother of Child Patient understands diagnosis:  Yes. Discussing patient identified problems/goals with staff:  No. Medical problems stabilized or resolved:  Yes. Denies suicidal/homicidal ideation: Yes. Issues/concerns per patient self-inventory:  No. Other:  New problem(s) identified: No, Describe:     Discharge Plan or Barriers:Discharge home to [facility] in [facility], Follow up at [facility] for Medication Management and Therapy  Reason for Continuation of Hospitalization: NONE, Discharge today  Comments:  Estimated length of stay:0 days  New goal(s):  Review of initial/current patient goals per problem list:   See Plan of Care  Attendees: Patient:  Charles Garrison 6/23/20164:51 PM  Family:   6/23/20164:51 PM  Physician:  Radene Journey, MD 6/23/20164:51 PM  Nursing: Shelia Media, RN 6/23/20164:51 PM  Case Manager:   6/23/20164:51 PM  Counselor:   6/23/20164:51 PM  Other:  Jake Shark, LCSW 6/23/20164:51 PM  Other:  Charleston Ropes, LCSWA 6/23/20164:51 PM  Other:  Hershal Coria,  LRT 6/23/20164:51 PM  Other:  6/23/20164:51 PM  Other:  6/23/20164:51 PM  Other:  6/23/20164:51 PM  Other:  6/23/20164:51 PM  Other:  6/23/20164:51 PM  Other:  6/23/20164:51 PM  Other:   6/23/20164:51 PM   Scribe for Treatment Team:   Jake Shark P,MSW, LCSW 01/20/2015, 4:51 PM

## 2015-01-20 NOTE — Progress Notes (Signed)
Patient discharge instructions reviewed and prescriptions give along with prefilled medication bottles from [facility]. Follow up appointment reminder.Belongings returned.  Patient transported via mother to destination.

## 2015-01-20 NOTE — Progress Notes (Signed)
  [facility] Adult Case Management Discharge Plan :  Will you be returning to the same living situation after discharge:  No. At discharge, do you have transportation home?: Yes,  Mom to pick up Do you have the ability to pay for your medications: Yes,   Medicaid  Release of information consent forms completed and in the chart;  Patient's signature needed at discharge.  Patient to Follow up at: Follow-up Information    Follow up with [facility]. Go on 01/24/2015.   Why:  9:00am, , Hospital Follow up, Outpatient Medication Management, Therapy, Please take your photo ID and Insurance card   Contact information:   [address]  [place], [place] [address]  PHONE: [phone]  FAX: [phone]      Patient denies SI/HI: Yes,       Safety Planning and Suicide Prevention discussed: Yes,     Have you used any form of tobacco in the last 30 days? (Cigarettes, Smokeless Tobacco, Cigars, and/or Pipes): No  Has patient been referred to the Quitline?: N/A patient is not a smoker  Janean Eischen, Cleda Daub, MSW, LCSW 01/20/2015, 4:34 PM

## 2015-07-24 ENCOUNTER — Inpatient Hospital Stay: Admit: 2015-07-24 | Payer: Self-pay

## 2015-07-24 ENCOUNTER — Other Ambulatory Visit
Admission: RE | Admit: 2015-07-24 | Discharge: 2015-07-24 | Disposition: A | Discharge status: Home / Self Care | Attending: Family Medicine | Admitting: Family Medicine

## 2015-07-24 NOTE — Initial Assessments (Signed)
Pt. Refusing blood draw.  BPD with patient.  Pt. In Handcuffs.  Jefm PettyJ. Comer (officer)

## 2015-09-28 HISTORY — PX: CERVICAL SPINE SURGERY: SHX589

## 2015-10-17 DIAGNOSIS — S13101A Dislocation of unspecified cervical vertebrae, initial encounter: Secondary | ICD-10-CM

## 2015-10-17 HISTORY — DX: Dislocation of unspecified cervical vertebrae, initial encounter: S13.101A

## 2016-01-23 ENCOUNTER — Emergency Department: Payer: Medicaid Other

## 2016-01-23 ENCOUNTER — Emergency Department
Admission: EM | Admit: 2016-01-23 | Discharge: 2016-01-23 | Disposition: A | Discharge status: Home / Self Care | Payer: Medicaid Other | Attending: Emergency Medicine | Admitting: Emergency Medicine

## 2016-01-23 DIAGNOSIS — F319 Bipolar disorder, unspecified: Secondary | ICD-10-CM | POA: Diagnosis not present

## 2016-01-23 DIAGNOSIS — F10120 Alcohol abuse with intoxication, uncomplicated: Secondary | ICD-10-CM | POA: Diagnosis not present

## 2016-01-23 DIAGNOSIS — F10920 Alcohol use, unspecified with intoxication, uncomplicated: Secondary | ICD-10-CM

## 2016-01-23 DIAGNOSIS — T07XXXA Unspecified multiple injuries, initial encounter: Secondary | ICD-10-CM

## 2016-01-23 DIAGNOSIS — F4321 Adjustment disorder with depressed mood: Secondary | ICD-10-CM | POA: Insufficient documentation

## 2016-01-23 DIAGNOSIS — Z87891 Personal history of nicotine dependence: Secondary | ICD-10-CM | POA: Diagnosis not present

## 2016-01-23 DIAGNOSIS — Z79899 Other long term (current) drug therapy: Secondary | ICD-10-CM | POA: Insufficient documentation

## 2016-01-23 DIAGNOSIS — M542 Cervicalgia: Secondary | ICD-10-CM | POA: Diagnosis not present

## 2016-01-23 DIAGNOSIS — Y999 Unspecified external cause status: Secondary | ICD-10-CM | POA: Diagnosis not present

## 2016-01-23 DIAGNOSIS — S6991XA Unspecified injury of right wrist, hand and finger(s), initial encounter: Secondary | ICD-10-CM | POA: Diagnosis present

## 2016-01-23 DIAGNOSIS — Y929 Unspecified place or not applicable: Secondary | ICD-10-CM | POA: Diagnosis not present

## 2016-01-23 DIAGNOSIS — W108XXA Fall (on) (from) other stairs and steps, initial encounter: Secondary | ICD-10-CM | POA: Insufficient documentation

## 2016-01-23 DIAGNOSIS — Y939 Activity, unspecified: Secondary | ICD-10-CM | POA: Insufficient documentation

## 2016-01-23 DIAGNOSIS — S80211A Abrasion, right knee, initial encounter: Secondary | ICD-10-CM | POA: Diagnosis not present

## 2016-01-23 DIAGNOSIS — S80212A Abrasion, left knee, initial encounter: Secondary | ICD-10-CM | POA: Insufficient documentation

## 2016-01-23 DIAGNOSIS — F1490 Cocaine use, unspecified, uncomplicated: Secondary | ICD-10-CM | POA: Diagnosis not present

## 2016-01-23 LAB — CBC WITH DIFFERENTIAL/PLATELET
Basophils Absolute: 0.1 10*3/uL (ref 0–0.1)
Basophils Relative: 1 %
EOS ABS: 0 10*3/uL (ref 0–0.7)
Eosinophils Relative: 0 %
HEMATOCRIT: 42.2 % (ref 40.0–52.0)
HEMOGLOBIN: 14.4 g/dL (ref 13.0–18.0)
LYMPHS ABS: 1.6 10*3/uL (ref 1.0–3.6)
LYMPHS PCT: 16 %
MCH: 29.5 pg (ref 26.0–34.0)
MCHC: 34.1 g/dL (ref 32.0–36.0)
MCV: 86.6 fL (ref 80.0–100.0)
MONOS PCT: 10 %
Monocytes Absolute: 1 10*3/uL (ref 0.2–1.0)
NEUTROS ABS: 6.9 10*3/uL — AB (ref 1.4–6.5)
NEUTROS PCT: 73 %
Platelets: 245 10*3/uL (ref 150–440)
RBC: 4.87 MIL/uL (ref 4.40–5.90)
RDW: 14.4 % (ref 11.5–14.5)
WBC: 9.5 10*3/uL (ref 3.8–10.6)

## 2016-01-23 LAB — BASIC METABOLIC PANEL
Anion gap: 9 (ref 5–15)
BUN: 12 mg/dL (ref 6–20)
CHLORIDE: 107 mmol/L (ref 101–111)
CO2: 24 mmol/L (ref 22–32)
Calcium: 9.1 mg/dL (ref 8.9–10.3)
Creatinine, Ser: 1.16 mg/dL (ref 0.61–1.24)
GFR calc Af Amer: >60 mL/min (ref >60)
GFR calc non Af Amer: >60 mL/min (ref >60)
Glucose, Bld: 81 mg/dL (ref 65–99)
POTASSIUM: 4.2 mmol/L (ref 3.5–5.1)
Sodium: 140 mmol/L (ref 135–145)

## 2016-01-23 LAB — ETHANOL: Alcohol, Ethyl (B): 166 mg/dL — ABNORMAL HIGH (ref <5)

## 2016-01-23 LAB — GLUCOSE, CAPILLARY: GLUCOSE-CAPILLARY: 76 mg/dL (ref 65–99)

## 2016-01-23 MED ORDER — SODIUM CHLORIDE 0.9 % IV BOLUS (SEPSIS)
1000.0000 mL | Freq: Once | INTRAVENOUS | Status: AC
  Administered 2016-01-23: 1000 mL via INTRAVENOUS

## 2016-01-23 NOTE — ED Notes (Signed)
Dr. Dolores FrameSung states may discharge pt at this time.

## 2016-01-23 NOTE — ED Notes (Signed)
Additional warm blankets, tv remote and urinal provided to pt.
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
## 2016-01-23 NOTE — ED Notes (Signed)
Pt sleeping, resps unlabored. Pt awoken from sleep to encourage to find a ride home. Pt states "i'll work on it."
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
## 2016-01-23 NOTE — ED Notes (Signed)
Knee abrasions cleansed and dressed.

## 2016-01-23 NOTE — ED Notes (Signed)
Pt not in room.
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
## 2016-01-23 NOTE — Discharge Instructions (Signed)
Try soft neck collar for your comfort at night. Return to the ER for worsening symptoms, persistent vomiting, difficulty breathing or other concerns.  Cervical Strain and Sprain With Rehab Cervical strain and sprain are injuries that commonly occur with "whiplash" injuries. Whiplash occurs when the neck is forcefully whipped backward or forward, such as during a motor vehicle accident or during contact sports. The muscles, ligaments, tendons, discs, and nerves of the neck are susceptible to injury when this occurs. RISK FACTORS Risk of having a whiplash injury increases if:  Osteoarthritis of the spine.  Situations that make head or neck accidents or trauma more likely.  High-risk sports (football, rugby, wrestling, hockey, auto racing, gymnastics, diving, contact karate, or boxing).  Poor strength and flexibility of the neck.  Previous neck injury.  Poor tackling technique.  Improperly fitted or padded equipment. SYMPTOMS   Pain or stiffness in the front or back of neck or both.  Symptoms may present immediately or up to 24 hours after injury.  Dizziness, headache, nausea, and vomiting.  Muscle spasm with soreness and stiffness in the neck.  Tenderness and swelling at the injury site. PREVENTION  Learn and use proper technique (avoid tackling with the head, spearing, and head-butting; use proper falling techniques to avoid landing on the head).  Warm up and stretch properly before activity.  Maintain physical fitness:  Strength, flexibility, and endurance.  Cardiovascular fitness.  Wear properly fitted and padded protective equipment, such as padded soft collars, for participation in contact sports. PROGNOSIS  Recovery from cervical strain and sprain injuries is dependent on the extent of the injury. These injuries are usually curable in 1 week to 3 months with appropriate treatment.  RELATED COMPLICATIONS   Temporary numbness and weakness may occur if the nerve roots  are damaged, and this may persist until the nerve has completely healed.  Chronic pain due to frequent recurrence of symptoms.  Prolonged healing, especially if activity is resumed too soon (before complete recovery). TREATMENT  Treatment initially involves the use of ice and medication to help reduce pain and inflammation. It is also important to perform strengthening and stretching exercises and modify activities that worsen symptoms so the injury does not get worse. These exercises may be performed at home or with a therapist. For patients who experience severe symptoms, a soft, padded collar may be recommended to be worn around the neck.  Improving your posture may help reduce symptoms. Posture improvement includes pulling your chin and abdomen in while sitting or standing. If you are sitting, sit in a firm chair with your buttocks against the back of the chair. While sleeping, try replacing your pillow with a small towel rolled to 2 inches in diameter, or use a cervical pillow or soft cervical collar. Poor sleeping positions delay healing.  For patients with nerve root damage, which causes numbness or weakness, the use of a cervical traction apparatus may be recommended. Surgery is rarely necessary for these injuries. However, cervical strain and sprains that are present at birth (congenital) may require surgery. MEDICATION   If pain medication is necessary, nonsteroidal anti-inflammatory medications, such as aspirin and ibuprofen, or other minor pain relievers, such as acetaminophen, are often recommended.  Do not take pain medication for 7 days before surgery.  Prescription pain relievers may be given if deemed necessary by your caregiver. Use only as directed and only as much as you need. HEAT AND COLD:   Cold treatment (icing) relieves pain and reduces inflammation. Cold treatment should  be applied for 10 to 15 minutes every 2 to 3 hours for inflammation and pain and immediately after any  activity that aggravates your symptoms. Use ice packs or an ice massage.  Heat treatment may be used prior to performing the stretching and strengthening activities prescribed by your caregiver, physical therapist, or athletic trainer. Use a heat pack or a warm soak. SEEK MEDICAL CARE IF:   Symptoms get worse or do not improve in 2 weeks despite treatment.  New, unexplained symptoms develop (drugs used in treatment may produce side effects). EXERCISES RANGE OF MOTION (ROM) AND STRETCHING EXERCISES - Cervical Strain and Sprain These exercises may help you when beginning to rehabilitate your injury. In order to successfully resolve your symptoms, you must improve your posture. These exercises are designed to help reduce the forward-head and rounded-shoulder posture which contributes to this condition. Your symptoms may resolve with or without further involvement from your physician, physical therapist or athletic trainer. While completing these exercises, remember:   Restoring tissue flexibility helps normal motion to return to the joints. This allows healthier, less painful movement and activity.  An effective stretch should be held for at least 20 seconds, although you may need to begin with shorter hold times for comfort.  A stretch should never be painful. You should only feel a gentle lengthening or release in the stretched tissue. STRETCH- Axial Extensors  Lie on your back on the floor. You may bend your knees for comfort. Place a rolled-up hand towel or dish towel, about 2 inches in diameter, under the part of your head that makes contact with the floor.  Gently tuck your chin, as if trying to make a "double chin," until you feel a gentle stretch at the base of your head.  Hold __________ seconds. Repeat __________ times. Complete this exercise __________ times per day.  STRETCH - Axial Extension   Stand or sit on a firm surface. Assume a good posture: chest up, shoulders drawn back,  abdominal muscles slightly tense, knees unlocked (if standing) and feet hip width apart.  Slowly retract your chin so your head slides back and your chin slightly lowers. Continue to look straight ahead.  You should feel a gentle stretch in the back of your head. Be certain not to feel an aggressive stretch since this can cause headaches later.  Hold for __________ seconds. Repeat __________ times. Complete this exercise __________ times per day. STRETCH - Cervical Side Bend   Stand or sit on a firm surface. Assume a good posture: chest up, shoulders drawn back, abdominal muscles slightly tense, knees unlocked (if standing) and feet hip width apart.  Without letting your nose or shoulders move, slowly tip your right / left ear to your shoulder until your feel a gentle stretch in the muscles on the opposite side of your neck.  Hold __________ seconds. Repeat __________ times. Complete this exercise __________ times per day. STRETCH - Cervical Rotators   Stand or sit on a firm surface. Assume a good posture: chest up, shoulders drawn back, abdominal muscles slightly tense, knees unlocked (if standing) and feet hip width apart.  Keeping your eyes level with the ground, slowly turn your head until you feel a gentle stretch along the back and opposite side of your neck.  Hold __________ seconds. Repeat __________ times. Complete this exercise __________ times per day. RANGE OF MOTION - Neck Circles   Stand or sit on a firm surface. Assume a good posture: chest up, shoulders drawn back,  abdominal muscles slightly tense, knees unlocked (if standing) and feet hip width apart.  Gently roll your head down and around from the back of one shoulder to the back of the other. The motion should never be forced or painful.  Repeat the motion 10-20 times, or until you feel the neck muscles relax and loosen. Repeat __________ times. Complete the exercise __________ times per day. STRENGTHENING EXERCISES  - Cervical Strain and Sprain These exercises may help you when beginning to rehabilitate your injury. They may resolve your symptoms with or without further involvement from your physician, physical therapist, or athletic trainer. While completing these exercises, remember:   Muscles can gain both the endurance and the strength needed for everyday activities through controlled exercises.  Complete these exercises as instructed by your physician, physical therapist, or athletic trainer. Progress the resistance and repetitions only as guided.  You may experience muscle soreness or fatigue, but the pain or discomfort you are trying to eliminate should never worsen during these exercises. If this pain does worsen, stop and make certain you are following the directions exactly. If the pain is still present after adjustments, discontinue the exercise until you can discuss the trouble with your clinician. STRENGTH - Cervical Flexors, Isometric  Face a wall, standing about 6 inches away. Place a small pillow, a ball about 6-8 inches in diameter, or a folded towel between your forehead and the wall.  Slightly tuck your chin and gently push your forehead into the soft object. Push only with mild to moderate intensity, building up tension gradually. Keep your jaw and forehead relaxed.  Hold 10 to 20 seconds. Keep your breathing relaxed.  Release the tension slowly. Relax your neck muscles completely before you start the next repetition. Repeat __________ times. Complete this exercise __________ times per day. STRENGTH- Cervical Lateral Flexors, Isometric   Stand about 6 inches away from a wall. Place a small pillow, a ball about 6-8 inches in diameter, or a folded towel between the side of your head and the wall.  Slightly tuck your chin and gently tilt your head into the soft object. Push only with mild to moderate intensity, building up tension gradually. Keep your jaw and forehead relaxed.  Hold 10  to 20 seconds. Keep your breathing relaxed.  Release the tension slowly. Relax your neck muscles completely before you start the next repetition. Repeat __________ times. Complete this exercise __________ times per day. STRENGTH - Cervical Extensors, Isometric   Stand about 6 inches away from a wall. Place a small pillow, a ball about 6-8 inches in diameter, or a folded towel between the back of your head and the wall.  Slightly tuck your chin and gently tilt your head back into the soft object. Push only with mild to moderate intensity, building up tension gradually. Keep your jaw and forehead relaxed.  Hold 10 to 20 seconds. Keep your breathing relaxed.  Release the tension slowly. Relax your neck muscles completely before you start the next repetition. Repeat __________ times. Complete this exercise __________ times per day. POSTURE AND BODY MECHANICS CONSIDERATIONS - Cervical Strain and Sprain Keeping correct posture when sitting, standing or completing your activities will reduce the stress put on different body tissues, allowing injured tissues a chance to heal and limiting painful experiences. The following are general guidelines for improved posture. Your physician or physical therapist will provide you with any instructions specific to your needs. While reading these guidelines, remember:  The exercises prescribed by your provider will  help you have the flexibility and strength to maintain correct postures.  The correct posture provides the optimal environment for your joints to work. All of your joints have less wear and tear when properly supported by a spine with good posture. This means you will experience a healthier, less painful body.  Correct posture must be practiced with all of your activities, especially prolonged sitting and standing. Correct posture is as important when doing repetitive low-stress activities (typing) as it is when doing a single heavy-load activity  (lifting). PROLONGED STANDING WHILE SLIGHTLY LEANING FORWARD When completing a task that requires you to lean forward while standing in one place for a long time, place either foot up on a stationary 2- to 4-inch high object to help maintain the best posture. When both feet are on the ground, the low back tends to lose its slight inward curve. If this curve flattens (or becomes too large), then the back and your other joints will experience too much stress, fatigue more quickly, and can cause pain.  RESTING POSITIONS Consider which positions are most painful for you when choosing a resting position. If you have pain with flexion-based activities (sitting, bending, stooping, squatting), choose a position that allows you to rest in a less flexed posture. You would want to avoid curling into a fetal position on your side. If your pain worsens with extension-based activities (prolonged standing, working overhead), avoid resting in an extended position such as sleeping on your stomach. Most people will find more comfort when they rest with their spine in a more neutral position, neither too rounded nor too arched. Lying on a non-sagging bed on your side with a pillow between your knees, or on your back with a pillow under your knees will often provide some relief. Keep in mind, being in any one position for a prolonged period of time, no matter how correct your posture, can still lead to stiffness. WALKING Walk with an upright posture. Your ears, shoulders, and hips should all line up. OFFICE WORK When working at a desk, create an environment that supports good, upright posture. Without extra support, muscles fatigue and lead to excessive strain on joints and other tissues. CHAIR:  A chair should be able to slide under your desk when your back makes contact with the back of the chair. This allows you to work closely.  The chair's height should allow your eyes to be level with the upper part of your monitor  and your hands to be slightly lower than your elbows.  Body position:  Your feet should make contact with the floor. If this is not possible, use a foot rest.  Keep your ears over your shoulders. This will reduce stress on your neck and low back.   This information is not intended to replace advice given to you by your health care provider. Make sure you discuss any questions you have with your health care provider.   Document Released: 07/16/2005 Document Revised: 08/06/2014 Document Reviewed: 10/28/2008 Elsevier Interactive Patient Education 2016 [employer].  Alcohol Intoxication Alcohol intoxication occurs when you drink enough alcohol that it affects your ability to function. It can be mild or very severe. Drinking a lot of alcohol in a short time is called binge drinking. This can be very harmful. Drinking alcohol can also be more dangerous if you are taking medicines or other drugs. Some of the effects caused by alcohol may include:  Loss of coordination.  Changes in mood and behavior.  Unclear thinking.  Trouble talking (slurred speech).  Throwing up (vomiting).  Confusion.  Slowed breathing.  Twitching and shaking (seizures).  Loss of consciousness. HOME CARE  Do not drive after drinking alcohol.  Drink enough water and fluids to keep your pee (urine) clear or pale yellow. Avoid caffeine.  Only take medicine as told by your doctor. GET HELP IF:  You throw up (vomit) many times.  You do not feel better after a few days.  You frequently have alcohol intoxication. Your doctor can help decide if you should see a substance use treatment counselor. GET HELP RIGHT AWAY IF:  You become shaky when you stop drinking.  You have twitching and shaking.  You throw up blood. It may look bright red or like coffee grounds.  You notice blood in your poop (bowel movements).  You become lightheaded or pass out (faint). MAKE SURE YOU:   Understand these  instructions.  Will watch your condition.  Will get help right away if you are not doing well or get worse.   This information is not intended to replace advice given to you by your health care provider. Make sure you discuss any questions you have with your health care provider.   Document Released: 01/02/2008 Document Revised: 03/18/2013 Document Reviewed: 12/19/2012 Else[employer] Interactive Patient Education [employer]

## 2017-07-28 ENCOUNTER — Other Ambulatory Visit: Payer: Self-pay

## 2017-07-28 ENCOUNTER — Encounter: Payer: Self-pay | Admitting: Emergency Medicine

## 2017-07-28 ENCOUNTER — Emergency Department
Admission: EM | Admit: 2017-07-28 | Discharge: 2017-07-28 | Disposition: A | Discharge status: Home / Self Care | Payer: Medicaid Other | Attending: Student in an Organized Health Care Education/Training Program | Admitting: Student in an Organized Health Care Education/Training Program

## 2017-07-28 ENCOUNTER — Emergency Department: Payer: Medicaid Other

## 2017-07-28 DIAGNOSIS — Z79899 Other long term (current) drug therapy: Secondary | ICD-10-CM | POA: Diagnosis not present

## 2017-07-28 DIAGNOSIS — Z87891 Personal history of nicotine dependence: Secondary | ICD-10-CM | POA: Insufficient documentation

## 2017-07-28 DIAGNOSIS — R1013 Epigastric pain: Secondary | ICD-10-CM | POA: Insufficient documentation

## 2017-07-28 DIAGNOSIS — R112 Nausea with vomiting, unspecified: Secondary | ICD-10-CM | POA: Diagnosis not present

## 2017-07-28 DIAGNOSIS — F1410 Cocaine abuse, uncomplicated: Secondary | ICD-10-CM | POA: Insufficient documentation

## 2017-07-28 DIAGNOSIS — F1210 Cannabis abuse, uncomplicated: Secondary | ICD-10-CM | POA: Diagnosis not present

## 2017-07-28 HISTORY — DX: Herpesviral infection of urogenital system, unspecified: A60.00

## 2017-07-28 HISTORY — DX: Major depressive disorder, single episode, unspecified: F32.9

## 2017-07-28 HISTORY — DX: Depression, unspecified: F32.A

## 2017-07-28 LAB — CBC WITH DIFFERENTIAL/PLATELET
BASOS ABS: 0.1 10*3/uL (ref 0–0.1)
BASOS PCT: 1 %
EOS ABS: 0 10*3/uL (ref 0–0.7)
Eosinophils Relative: 0 %
HEMATOCRIT: 48.8 % (ref 40.0–52.0)
HEMOGLOBIN: 16.1 g/dL (ref 13.0–18.0)
Lymphocytes Relative: 30 %
Lymphs Abs: 2.5 10*3/uL (ref 1.0–3.6)
MCH: 30.6 pg (ref 26.0–34.0)
MCHC: 33.1 g/dL (ref 32.0–36.0)
MCV: 92.4 fL (ref 80.0–100.0)
Monocytes Absolute: 0.8 10*3/uL (ref 0.2–1.0)
Monocytes Relative: 10 %
NEUTROS ABS: 4.7 10*3/uL (ref 1.4–6.5)
NEUTROS PCT: 59 %
Platelets: 314 10*3/uL (ref 150–440)
RBC: 5.28 MIL/uL (ref 4.40–5.90)
RDW: 14.2 % (ref 11.5–14.5)
WBC: 8.1 10*3/uL (ref 3.8–10.6)

## 2017-07-28 LAB — COMPREHENSIVE METABOLIC PANEL
ALBUMIN: 3.5 g/dL (ref 3.5–5.0)
ALK PHOS: 41 U/L (ref 38–126)
ALT: 11 U/L — AB (ref 17–63)
ANION GAP: 7 (ref 5–15)
AST: 19 U/L (ref 15–41)
BUN: 7 mg/dL (ref 6–20)
CALCIUM: 8.5 mg/dL — AB (ref 8.9–10.3)
CO2: 26 mmol/L (ref 22–32)
Chloride: 107 mmol/L (ref 101–111)
Creatinine, Ser: 1.3 mg/dL — ABNORMAL HIGH (ref 0.61–1.24)
GFR calc Af Amer: >60 mL/min (ref >60)
GFR calc non Af Amer: >60 mL/min (ref >60)
GLUCOSE: 88 mg/dL (ref 65–99)
Potassium: 3.9 mmol/L (ref 3.5–5.1)
SODIUM: 140 mmol/L (ref 135–145)
Total Bilirubin: 0.8 mg/dL (ref 0.3–1.2)
Total Protein: 6.4 g/dL — ABNORMAL LOW (ref 6.5–8.1)

## 2017-07-28 LAB — LIPASE, BLOOD: Lipase: 21 U/L (ref 11–51)

## 2017-07-28 MED ORDER — MORPHINE SULFATE (PF) 4 MG/ML IV SOLN
4.0000 mg | INTRAVENOUS | Status: DC | PRN
  Administered 2017-07-28: 4 mg via INTRAVENOUS
  Filled 2017-07-28: qty 1

## 2017-07-28 MED ORDER — GI COCKTAIL ~~LOC~~
30.0000 mL | Freq: Once | ORAL | Status: AC
  Administered 2017-07-28: 30 mL via ORAL
  Filled 2017-07-28: qty 30

## 2017-07-28 MED ORDER — POLYETHYLENE GLYCOL 3350 17 G PO PACK: 17.0000 g | PACK | Freq: Every day | ORAL | 0 refills | Status: DC

## 2017-07-28 MED ORDER — RANITIDINE HCL 150 MG PO TABS: 150.0000 mg | ORAL_TABLET | Freq: Two times a day (BID) | ORAL | 1 refills | Status: DC

## 2017-07-28 MED ORDER — SODIUM CHLORIDE 0.9 % IV BOLUS (SEPSIS)
1000.0000 mL | Freq: Once | INTRAVENOUS | Status: AC
  Administered 2017-07-28: 1000 mL via INTRAVENOUS

## 2017-07-28 MED ORDER — PROMETHAZINE HCL 12.5 MG PO TABS: 12.5000 mg | ORAL_TABLET | Freq: Four times a day (QID) | ORAL | 0 refills | Status: DC | PRN

## 2017-07-28 MED ORDER — PROMETHAZINE HCL 25 MG/ML IJ SOLN
12.5000 mg | Freq: Four times a day (QID) | INTRAMUSCULAR | Status: DC | PRN
  Administered 2017-07-28: 12.5 mg via INTRAVENOUS
  Filled 2017-07-28: qty 1

## 2017-07-28 NOTE — ED Provider Notes (Signed)
[facility] Emergency Department Provider Note    First MD Initiated Contact with Patient 07/28/17 [phone]     (approximate)  I have reviewed the triage vital signs and the nursing notes.   HISTORY  Chief Complaint Abdominal Pain    HPI Charles Rainimothy L Genna is a 46 y.o. male with no recent hospitalizations presents with 2-3 days of epigastric pain nausea vomiting.  States he had one episode of vomiting that looked like coffee grounds but since then resolved.  Feels that he is dropped 30 pounds over a month.  Does have a history of acid reflux.  Denies any pain radiating through to his back or shoulder.  No melena or hematochezia.  No fevers.  Does have a history of polysubstance abuse.  No history of cirrhosis or liver disease.  Past Medical History:  Diagnosis Date  . Bipolar 1 disorder (HCC)   . Depression   . Genital herpes    History reviewed. No pertinent family history. Past Surgical History:  Procedure Laterality Date  . NECK SURGERY     Patient Active Problem List   Diagnosis Date Noted  . Alcohol use disorder, severe, dependence (HCC) 01/18/2015  . Stimulant use disorder (cocaine) 01/18/2015  . Cannabis use disorder, moderate, dependence (HCC) 01/18/2015  . Genital herpes 01/18/2015  . Adjustment disorder with depressed mood 01/18/2015      Prior to Admission medications   Medication Sig Start Date End Date Taking? Authorizing Provider  FLUoxetine (PROZAC) 10 MG capsule Take 1 capsule (10 mg total) by mouth daily. Patient taking differently: Take 20 mg by mouth daily.  01/20/15  Yes Jimmy FootmanHernandez-Gonzalez, Andrea, MD  hydrOXYzine (ATARAX/VISTARIL) 50 MG tablet Take 1 tablet (50 mg total) by mouth 3 (three) times daily as needed. 01/20/15  Yes Jimmy FootmanHernandez-Gonzalez, Andrea, MD  traZODone (DESYREL) 50 MG tablet Take 1 tablet (50 mg total) by mouth at bedtime as needed for sleep. 01/20/15  Yes Jimmy FootmanHernandez-Gonzalez, Andrea, MD  valACYclovir (VALTREX) 500 MG  tablet Take 1 tablet (500 mg total) by mouth daily. 01/20/15  Yes Jimmy FootmanHernandez-Gonzalez, Andrea, MD  polyethylene glycol (MIRALAX / GLYCOLAX) packet Take 17 g by mouth daily. Mix one tablespoon with 8oz of your favorite juice or water every day until you are having soft formed stools. Then start taking once daily if you didn't have a stool the day before. 07/28/17   Willy Eddyobinson, Majesti Gambrell, MD  promethazine (PHENERGAN) 12.5 MG tablet Take 1 tablet (12.5 mg total) by mouth every 6 (six) hours as needed for nausea or vomiting. 07/28/17   Willy Eddyobinson, Charlii Yost, MD  ranitidine (ZANTAC) 150 MG tablet Take 1 tablet (150 mg total) by mouth 2 (two) times daily. 07/28/17 07/28/18  Willy Eddyobinson, Eyleen Rawlinson, MD    Allergies Patient has no known allergies.    Social History Social History   Tobacco Use  . Smoking status: Former Smoker    Types: Cigarettes  . Smokeless tobacco: Never Used  Substance Use Topics  . Alcohol use: Yes    Comment: occasionally  . Drug use: Yes    Types: Cocaine, Marijuana    Comment: last used 1 month ago    Review of Systems Patient denies headaches, rhinorrhea, blurry vision, numbness, shortness of breath, chest pain, edema, cough, abdominal pain, nausea, vomiting, diarrhea, dysuria, fevers, rashes or hallucinations unless otherwise stated above in HPI. ____________________________________________   PHYSICAL EXAM:  VITAL SIGNS: Vitals:   07/28/17 0657 07/28/17 0810  BP: 118/73 112/82  Pulse: 83 84  Resp: 18 18  Temp: 98 F (36.7 C)   SpO2:  97%    Constitutional: Alert and oriented. Well appearing and in no acute distress. Eyes: Conjunctivae are normal.  Head: Atraumatic. Nose: No congestion/rhinnorhea. Mouth/Throat: Mucous membranes are moist.   Neck: No stridor. Painless ROM.  Cardiovascular: Normal rate, regular rhythm. Grossly normal heart sounds.  Good peripheral circulation. Respiratory: Normal respiratory effort.  No retractions. Lungs CTAB. Gastrointestinal:  Soft and nontender. No distention. No abdominal bruits. No CVA tenderness. Genitourinary:  Musculoskeletal: No lower extremity tenderness nor edema.  No joint effusions. Neurologic:  Normal speech and language. No gross focal neurologic deficits are appreciated. No facial droop Skin:  Skin is warm, dry and intact. No rash noted. Psychiatric: Mood and affect are normal. Speech and behavior are normal.  ____________________________________________   LABS (all labs ordered are listed, but only abnormal results are displayed)  Results for orders placed or performed during the hospital encounter of 07/28/17 (from the past 24 hour(s))  CBC with Differential/Platelet     Status: None   Collection Time: 07/28/17  7:00 AM  Result Value Ref Range   WBC 8.1 3.8 - 10.6 K/uL   RBC 5.28 4.40 - 5.90 MIL/uL   Hemoglobin 16.1 13.0 - 18.0 g/dL   HCT 16.1 09.6 - 04.5 %   MCV 92.4 80.0 - 100.0 fL   MCH 30.6 26.0 - 34.0 pg   MCHC 33.1 32.0 - 36.0 g/dL   RDW 40.9 81.1 - 91.4 %   Platelets 314 150 - 440 K/uL   Neutrophils Relative % 59 %   Neutro Abs 4.7 1.4 - 6.5 K/uL   Lymphocytes Relative 30 %   Lymphs Abs 2.5 1.0 - 3.6 K/uL   Monocytes Relative 10 %   Monocytes Absolute 0.8 0.2 - 1.0 K/uL   Eosinophils Relative 0 %   Eosinophils Absolute 0.0 0 - 0.7 K/uL   Basophils Relative 1 %   Basophils Absolute 0.1 0 - 0.1 K/uL  Comprehensive metabolic panel     Status: Abnormal   Collection Time: 07/28/17  7:00 AM  Result Value Ref Range   Sodium 140 135 - 145 mmol/L   Potassium 3.9 3.5 - 5.1 mmol/L   Chloride 107 101 - 111 mmol/L   CO2 26 22 - 32 mmol/L   Glucose, Bld 88 65 - 99 mg/dL   BUN 7 6 - 20 mg/dL   Creatinine, Ser 7.82 (H) 0.61 - 1.24 mg/dL   Calcium 8.5 (L) 8.9 - 10.3 mg/dL   Total Protein 6.4 (L) 6.5 - 8.1 g/dL   Albumin 3.5 3.5 - 5.0 g/dL   AST 19 15 - 41 U/L   ALT 11 (L) 17 - 63 U/L   Alkaline Phosphatase 41 38 - 126 U/L   Total Bilirubin 0.8 0.3 - 1.2 mg/dL   GFR calc non Af Amer  >60 >60 mL/min   GFR calc Af Amer >60 >60 mL/min   Anion gap 7 5 - 15  Lipase, blood     Status: None   Collection Time: 07/28/17  7:00 AM  Result Value Ref Range   Lipase 21 11 - 51 U/L   ____________________________________________  EKG My review and personal interpretation at Time: 7:02   Indication: abdominal pain  Rate: 80  Rhythm: sinus Axis: normal Other: no stemi, normal intervals ____________________________________________  RADIOLOGY  I personally reviewed all radiographic images ordered to evaluate for the above acute complaints and reviewed radiology reports and findings.  These findings were personally  discussed with the patient.  Please see medical record for radiology report.  ____________________________________________   PROCEDURES  Procedure(s) performed:  Procedures    Critical Care performed: no ____________________________________________   INITIAL IMPRESSION / ASSESSMENT AND PLAN / ED COURSE  Pertinent labs & imaging results that were available during my care of the patient were reviewed by me and considered in my medical decision making (see chart for details).  DDX: Gastritis, pancreatitis, cholelithiasis, cholecystitis, reflux, GI bleed, perforation  Charles Garrison is a 46 y.o. who presents to the ED with symptoms as described above.  Patient is afebrile hemodynamically stable.  His abdominal exam is benign.  We will check blood work, provide IV fluids as well as IV pain medication and antiemetic.  Clinically I do suspect some component of gastritis.  Reports one episode of coffee-ground emesis the patient has no report of melena or hematochezia.  No report of cirrhosis and he has no abdominal distention.  Have low suspicion for acute GI bleed.  EKG shows no evidence of acute ischemia.  The patient will be placed on continuous pulse oximetry and telemetry for monitoring.  Laboratory evaluation will be sent to evaluate for the above complaints.      Clinical Course as of Jul 28 817  Wynelle LinkSun Jul 28, 2017  0740 Blood work is reassuring.  There is no leukocytosis or left shift.  No acidosis.  Lites are otherwise reassuring.  Significant GI bleeding unlikely and further supported by the lack of BUN and stable hemoglobin at 16.  [PR]  0802 Patient reassessed.  Repeat abdominal exam soft and benign.  I do not believe that CT imaging is clinically indicated at this presentation.  Most consistent with some form of gastritis.  Patient will be discharged with an acid, anti-emetics as well as laxative.  Will give referral to GI.  Discussed strict return precautions.  Have discussed with the patient and available family all diagnostics and treatments performed thus far and all questions were answered to the best of my ability. The patient demonstrates understanding and agreement with plan.   [PR]    Clinical Course User Index [PR] Willy Eddyobinson, Falon Flinchum, MD     ____________________________________________   FINAL CLINICAL IMPRESSION(S) / ED DIAGNOSES  Final diagnoses:  Epigastric pain      NEW MEDICATIONS STARTED DURING THIS VISIT:  This SmartLink is deprecated. Use AVSMEDLIST instead to display the medication list for a patient.   Note:  This document was prepared using Dragon voice recognition software and may include unintentional dictation errors.    Willy Eddyobinson, Effie Wahlert, MD 07/28/17 [phone]

## 2017-07-28 NOTE — ED Triage Notes (Addendum)
EMS pt from home with c/o pain across upper abd for 2 days with coffee ground emesis x 1 last night; still nauseated; normal bowel movements; pain constant, sharp; pt adds he's dropped about 30 lbs in the last month

## 2017-07-28 NOTE — ED Notes (Signed)
Dr Roxan Hockeyobinson in to see pt

## 2017-07-28 NOTE — Discharge Instructions (Signed)

## 2022-05-25 ENCOUNTER — Other Ambulatory Visit: Payer: Self-pay | Admitting: Orthopedic Surgery

## 2022-05-25 DIAGNOSIS — M87051 Idiopathic aseptic necrosis of right femur: Secondary | ICD-10-CM

## 2022-06-14 ENCOUNTER — Inpatient Hospital Stay: Admission: RE | Admit: 2022-06-14 | Payer: Self-pay | Source: Ambulatory Visit

## 2022-08-06 ENCOUNTER — Inpatient Hospital Stay: Admission: RE | Admit: 2022-08-06 | Payer: Self-pay | Source: Ambulatory Visit

## 2022-12-12 ENCOUNTER — Other Ambulatory Visit: Payer: Self-pay | Admitting: Orthopedic Surgery

## 2022-12-12 DIAGNOSIS — M87051 Idiopathic aseptic necrosis of right femur: Secondary | ICD-10-CM

## 2022-12-20 ENCOUNTER — Other Ambulatory Visit: Payer: Self-pay | Admitting: Orthopedic Surgery

## 2022-12-20 DIAGNOSIS — M87051 Idiopathic aseptic necrosis of right femur: Secondary | ICD-10-CM

## 2022-12-25 ENCOUNTER — Other Ambulatory Visit: Payer: Self-pay | Admitting: Orthopedic Surgery

## 2022-12-25 DIAGNOSIS — M87051 Idiopathic aseptic necrosis of right femur: Secondary | ICD-10-CM

## 2023-01-01 ENCOUNTER — Ambulatory Visit

## 2023-01-08 ENCOUNTER — Ambulatory Visit
Admission: RE | Admit: 2023-01-08 | Discharge: 2023-01-08 | Disposition: A | Discharge status: Home / Self Care | Payer: 59 | Source: Ambulatory Visit | Attending: Orthopedic Surgery | Admitting: Orthopedic Surgery

## 2023-01-08 DIAGNOSIS — M87051 Idiopathic aseptic necrosis of right femur: Secondary | ICD-10-CM | POA: Insufficient documentation

## 2023-06-30 DIAGNOSIS — M87051 Idiopathic aseptic necrosis of right femur: Secondary | ICD-10-CM

## 2023-06-30 HISTORY — DX: Idiopathic aseptic necrosis of right femur: M87.051

## 2023-07-18 ENCOUNTER — Other Ambulatory Visit: Payer: Self-pay | Admitting: Orthopedic Surgery

## 2023-08-01 ENCOUNTER — Other Ambulatory Visit: Payer: Self-pay

## 2023-08-01 ENCOUNTER — Encounter
Admission: RE | Admit: 2023-08-01 | Discharge: 2023-08-01 | Disposition: A | Discharge status: Home / Self Care | Payer: 59 | Source: Ambulatory Visit | Attending: Orthopedic Surgery | Admitting: Orthopedic Surgery

## 2023-08-01 DIAGNOSIS — Z01812 Encounter for preprocedural laboratory examination: Secondary | ICD-10-CM

## 2023-08-01 DIAGNOSIS — Z01810 Encounter for preprocedural cardiovascular examination: Secondary | ICD-10-CM

## 2023-08-01 DIAGNOSIS — Z01818 Encounter for other preprocedural examination: Secondary | ICD-10-CM | POA: Diagnosis present

## 2023-08-01 HISTORY — DX: Cocaine use, unspecified, in remission: F14.91

## 2023-08-01 HISTORY — DX: Personal history of other mental and behavioral disorders: Z86.59

## 2023-08-01 HISTORY — DX: Gastro-esophageal reflux disease without esophagitis: K21.9

## 2023-08-01 LAB — CBC WITH DIFFERENTIAL/PLATELET
Abs Immature Granulocytes: 0.01 10*3/uL (ref 0.00–0.07)
Basophils Absolute: 0 10*3/uL (ref 0.0–0.1)
Basophils Relative: 1 %
Eosinophils Absolute: 0.1 10*3/uL (ref 0.0–0.5)
Eosinophils Relative: 2 %
HCT: 46.7 % (ref 39.0–52.0)
Hemoglobin: 15.2 g/dL (ref 13.0–17.0)
Immature Granulocytes: 0 %
Lymphocytes Relative: 33 %
Lymphs Abs: 1.7 10*3/uL (ref 0.7–4.0)
MCH: 29.8 pg (ref 26.0–34.0)
MCHC: 32.5 g/dL (ref 30.0–36.0)
MCV: 91.6 fL (ref 80.0–100.0)
Monocytes Absolute: 0.7 10*3/uL (ref 0.1–1.0)
Monocytes Relative: 13 %
Neutro Abs: 2.8 10*3/uL (ref 1.7–7.7)
Neutrophils Relative %: 51 %
Platelets: 294 10*3/uL (ref 150–400)
RBC: 5.1 MIL/uL (ref 4.22–5.81)
RDW: 13.5 % (ref 11.5–15.5)
WBC: 5.3 10*3/uL (ref 4.0–10.5)
nRBC: 0 % (ref 0.0–0.2)

## 2023-08-01 LAB — URINALYSIS, ROUTINE W REFLEX MICROSCOPIC
Bilirubin Urine: NEGATIVE
Glucose, UA: NEGATIVE mg/dL
Hgb urine dipstick: NEGATIVE
Ketones, ur: NEGATIVE mg/dL
Leukocytes,Ua: NEGATIVE
Nitrite: NEGATIVE
Protein, ur: NEGATIVE mg/dL
Specific Gravity, Urine: 1.021 (ref 1.005–1.030)
pH: 7 (ref 5.0–8.0)

## 2023-08-01 LAB — COMPREHENSIVE METABOLIC PANEL
ALT: 16 U/L (ref 0–44)
AST: 19 U/L (ref 15–41)
Albumin: 4.5 g/dL (ref 3.5–5.0)
Alkaline Phosphatase: 51 U/L (ref 38–126)
Anion gap: 10 (ref 5–15)
BUN: 17 mg/dL (ref 6–20)
CO2: 27 mmol/L (ref 22–32)
Calcium: 9.6 mg/dL (ref 8.9–10.3)
Chloride: 102 mmol/L (ref 98–111)
Creatinine, Ser: 1.36 mg/dL — ABNORMAL HIGH (ref 0.61–1.24)
GFR, Estimated: >60 mL/min (ref >60)
Glucose, Bld: 79 mg/dL (ref 70–99)
Potassium: 3.9 mmol/L (ref 3.5–5.1)
Sodium: 139 mmol/L (ref 135–145)
Total Bilirubin: 1.1 mg/dL (ref 0.0–1.2)
Total Protein: 8.1 g/dL (ref 6.5–8.1)

## 2023-08-01 LAB — TYPE AND SCREEN
ABO/RH(D): O POS
Antibody Screen: NEGATIVE

## 2023-08-01 LAB — SURGICAL PCR SCREEN
MRSA, PCR: NEGATIVE
Staphylococcus aureus: NEGATIVE

## 2023-08-01 NOTE — Patient Instructions (Addendum)
 Your procedure is scheduled on: Thursday, January 9 Report to the Registration Desk on the 1st floor of the [facility] To find out your arrival time, please call [phone] between 1PM - 3PM on: Wednesday, January 8 If your arrival time is 6:00 am, do not arrive before that time as the [facility] entrance doors do not open until 6:00 am.  REMEMBER: Instructions that are not followed completely may result in serious medical risk, up to and including death; or upon the discretion of your surgeon and anesthesiologist your surgery may need to be rescheduled.  Do not eat food after midnight the night before surgery.  No gum chewing or hard candies.  You may however, drink CLEAR liquids up to 2 hours before you are scheduled to arrive for your surgery. Do not drink anything within 2 hours of your scheduled arrival time.  Clear liquids include: - water  - apple juice without pulp - gatorade (not RED colors) - black coffee or tea (Do NOT add milk or creamers to the coffee or tea) Do NOT drink anything that is not on this list.  In addition, your doctor has ordered for you to drink the provided:  Ensure Pre-Surgery Clear Carbohydrate Drink  Drinking this carbohydrate drink up to two hours before surgery helps to reduce insulin resistance and improve patient outcomes. Please complete drinking 2 hours before scheduled arrival time.  One week prior to surgery: starting January 2 Stop Anti-inflammatories (NSAIDS) such as Advil, Aleve, Ibuprofen, Motrin, Naproxen, Naprosyn and Aspirin based products such as Excedrin, Goody's Powder, BC Powder. Stop ANY OVER THE COUNTER supplements until after surgery.  You may however, continue to take Tylenol  if needed for pain up until the day of surgery.  Continue taking all of your other prescription medications up until the day of surgery.  ON THE DAY OF SURGERY DO NOT TAKE ANY MEDICATIONS   No Alcohol for 24 hours before or after surgery.  No  Smoking including e-cigarettes for 24 hours before surgery.  No chewable tobacco products for at least 6 hours before surgery.  No nicotine patches on the day of surgery.  Do not use any recreational drugs for at least a week (preferably 2 weeks) before your surgery.  Please be advised that the combination of cocaine and anesthesia may have negative outcomes, up to and including death. If you test positive for cocaine, your surgery will be cancelled.  On the morning of surgery brush your teeth with toothpaste and water, you may rinse your mouth with mouthwash if you wish. Do not swallow any toothpaste or mouthwash.  Use CHG Soap as directed on instruction sheet.  Do not wear jewelry, make-up, hairpins, clips or nail polish.  For welded (permanent) jewelry: bracelets, anklets, waist bands, etc.  Please have this removed prior to surgery.  If it is not removed, there is a chance that hospital personnel will need to cut it off on the day of surgery.  Do not wear lotions, powders, or perfumes.   Do not shave body hair from the neck down 48 hours before surgery.  Contact lenses, hearing aids and dentures may not be worn into surgery.  Do not bring valuables to the hospital. [facility] is not responsible for any missing/lost belongings or valuables.   Notify your doctor if there is any change in your medical condition (cold, fever, infection).  Wear comfortable clothing (specific to your surgery type) to the hospital.  After surgery, you can help prevent lung  complications by doing breathing exercises.  Take deep breaths and cough every 1-2 hours. Your doctor may order a device called an Incentive Spirometer to help you take deep breaths.  If you are being admitted to the hospital overnight, leave your suitcase in the car. After surgery it may be brought to your room.  In case of increased patient census, it may be necessary for you, the patient, to continue your postoperative care in  the Same Day Surgery department.  If you are being discharged the day of surgery, you will not be allowed to drive home. You will need a responsible individual to drive you home and stay with you for 24 hours after surgery.   If you are taking public transportation, you will need to have a responsible individual with you.  Please call the Pre-admissions Testing Dept. at [phone] if you have any questions about these instructions.  Surgery Visitation Policy:  Patients having surgery or a procedure may have two visitors.  Children under the age of 52 must have an adult with them who is not the patient.  Inpatient Visitation:    Visiting hours are 7 a.m. to 8 p.m. Up to four visitors are allowed at one time in a patient room. The visitors may rotate out with other people during the day.  One visitor age 60 or older may stay with the patient overnight and must be in the room by 8 p.m.    Pre-operative 5 CHG Bath Instructions   You can play a key role in reducing the risk of infection after surgery. Your skin needs to be as free of germs as possible. You can reduce the number of germs on your skin by washing with CHG (chlorhexidine  gluconate) soap before surgery. CHG is an antiseptic soap that kills germs and continues to kill germs even after washing.   DO NOT use if you have an allergy to chlorhexidine /CHG or antibacterial soaps. If your skin becomes reddened or irritated, stop using the CHG and notify one of our RNs at [phone].   Please shower with the CHG soap starting 4 days before surgery using the following schedule:     Please keep in mind the following:  DO NOT shave, including legs and underarms, starting the day of your first shower.   You may shave your face at any point before/day of surgery.  Place clean sheets on your bed the day you start using CHG soap. Use a clean washcloth (not used since being washed) for each shower. DO NOT sleep with pets once you  start using the CHG.   CHG Shower Instructions:  If you choose to wash your hair and private area, wash first with your normal shampoo/soap.  After you use shampoo/soap, rinse your hair and body thoroughly to remove shampoo/soap residue.  Turn the water OFF and apply about 3 tablespoons (45 ml) of CHG soap to a CLEAN washcloth.  Apply CHG soap ONLY FROM YOUR NECK DOWN TO YOUR TOES (washing for 3-5 minutes)  DO NOT use CHG soap on face, private areas, open wounds, or sores.  Pay special attention to the area where your surgery is being performed.  If you are having back surgery, having someone wash your back for you may be helpful. Wait 2 minutes after CHG soap is applied, then you may rinse off the CHG soap.  Pat dry with a clean towel  Put on clean clothes/pajamas   If you choose to wear lotion, please use ONLY  the CHG-compatible lotions on the back of this paper.     Additional instructions for the day of surgery: DO NOT APPLY any lotions, deodorants, cologne, or perfumes.   Put on clean/comfortable clothes.  Brush your teeth.  Ask your nurse before applying any prescription medications to the skin.      CHG Compatible Lotions   Aveeno Moisturizing lotion  Cetaphil Moisturizing Cream  Cetaphil Moisturizing Lotion  Clairol Herbal Essence Moisturizing Lotion, Dry Skin  Clairol Herbal Essence Moisturizing Lotion, Extra Dry Skin  Clairol Herbal Essence Moisturizing Lotion, Normal Skin  Curel Age Defying Therapeutic Moisturizing Lotion with Alpha Hydroxy  Curel Extreme Care Body Lotion  Curel Soothing Hands Moisturizing Hand Lotion  Curel Therapeutic Moisturizing Cream, Fragrance-Free  Curel Therapeutic Moisturizing Lotion, Fragrance-Free  Curel Therapeutic Moisturizing Lotion, Original Formula  Eucerin Daily Replenishing Lotion  Eucerin Dry Skin Therapy Plus Alpha Hydroxy Crme  Eucerin Dry Skin Therapy Plus Alpha Hydroxy Lotion  Eucerin Original Crme  Eucerin Original  Lotion  Eucerin Plus Crme Eucerin Plus Lotion  Eucerin TriLipid Replenishing Lotion  Keri Anti-Bacterial Hand Lotion  Keri Deep Conditioning Original Lotion Dry Skin Formula Softly Scented  Keri Deep Conditioning Original Lotion, Fragrance Free Sensitive Skin Formula  Keri Lotion Fast Absorbing Fragrance Free Sensitive Skin Formula  Keri Lotion Fast Absorbing Softly Scented Dry Skin Formula  Keri Original Lotion  Keri Skin Renewal Lotion Keri Silky Smooth Lotion  Keri Silky Smooth Sensitive Skin Lotion  Nivea Body Creamy Conditioning Oil  Nivea Body Extra Enriched Lotion  Nivea Body Original Lotion  Nivea Body Sheer Moisturizing Lotion Nivea Crme  Nivea Skin Firming Lotion  NutraDerm 30 Skin Lotion  NutraDerm Skin Lotion  NutraDerm Therapeutic Skin Cream  NutraDerm Therapeutic Skin Lotion  ProShield Protective Hand Cream  Provon moisturizing lotion  Preoperative Educational Videos for Total Hip, Knee and Shoulder Replacements  To better prepare for surgery, please view our videos that explain the physical activity and discharge planning required to have the best surgical recovery at [facility].  [email or web address]  Questions? Call [phone] or email [email or web address]

## 2023-08-07 ENCOUNTER — Other Ambulatory Visit: Payer: Self-pay | Admitting: Orthopedic Surgery

## 2023-08-08 DIAGNOSIS — Z01812 Encounter for preprocedural laboratory examination: Secondary | ICD-10-CM

## 2023-08-09 ENCOUNTER — Encounter (HOSPITAL_COMMUNITY): Payer: Self-pay | Admitting: Urgent Care

## 2023-08-16 ENCOUNTER — Inpatient Hospital Stay
Admission: RE | Admit: 2023-08-16 | Discharge: 2023-08-16 | Disposition: A | Discharge status: Home / Self Care | Payer: 59 | Source: Ambulatory Visit

## 2023-08-16 NOTE — Progress Notes (Signed)
Surgery cancelled again for 1-23 due to surgery not being approved by insurance. Allyson states that surgeon is going to have to appeal the case. Pt aware of this

## 2023-08-16 NOTE — Patient Instructions (Addendum)
Your procedure is scheduled on:08-22-23 Thursday Report to the Registration Desk on the 1st floor of the [facility].Then proceed to the 2nd floor Surgery Desk To find out your arrival time, please call [phone] between 1PM - 3PM on:08-21-23 Wednesday If your arrival time is 6:00 am, do not arrive before that time as the [facility] entrance doors do not open until 6:00 am.  REMEMBER: Instructions that are not followed completely may result in serious medical risk, up to and including death; or upon the discretion of your surgeon and anesthesiologist your surgery may need to be rescheduled.  Do not eat food after midnight the night before surgery.  No gum chewing or hard candies.  You may however, drink CLEAR liquids up to 2 hours before you are scheduled to arrive for your surgery. Do not drink anything within 2 hours of your scheduled arrival time.  Clear liquids include: - water  - apple juice without pulp - gatorade (not RED colors) - black coffee or tea (Do NOT add milk or creamers to the coffee or tea) Do NOT drink anything that is not on this list.  In addition, your doctor has ordered for you to drink the provided:  Ensure Pre-Surgery Clear Carbohydrate Drink  Drinking this carbohydrate drink up to two hours before surgery helps to reduce insulin resistance and improve patient outcomes. Please complete drinking 2 hours before scheduled arrival time.  One week prior to surgery:Stop NOW (08-16-23) Stop Anti-inflammatories (NSAIDS) such as Advil, Aleve, Ibuprofen, Motrin, Naproxen, Naprosyn and Aspirin based products such as Excedrin, Goody's Powder, BC Powder. Stop ANY OVER THE COUNTER supplements until after surgery.  You may however, continue to take Tylenol if needed for pain up until the day of surgery.  Continue taking all of your other prescription medications up until the day of surgery.  Do NOT take any medication the day of surgery  No Alcohol for 24 hours before  or after surgery.  No Smoking including e-cigarettes for 24 hours before surgery.  No chewable tobacco products for at least 6 hours before surgery.  No nicotine patches on the day of surgery.  Do not use any "recreational" drugs for at least a week (preferably 2 weeks) before your surgery.  Please be advised that the combination of cocaine and anesthesia may have negative outcomes, up to and including death. If you test positive for cocaine, your surgery will be cancelled.  On the morning of surgery brush your teeth with toothpaste and water, you may rinse your mouth with mouthwash if you wish. Do not swallow any toothpaste or mouthwash.  Use CHG Soap as directed on instruction sheet.  Do not wear jewelry, make-up, hairpins, clips or nail polish.  For welded (permanent) jewelry: bracelets, anklets, waist bands, etc.  Please have this removed prior to surgery.  If it is not removed, there is a chance that hospital personnel will need to cut it off on the day of surgery.  Do not wear lotions, powders, or perfumes.   Do not shave body hair from the neck down 48 hours before surgery.  Contact lenses, hearing aids and dentures may not be worn into surgery.  Do not bring valuables to the hospital. [facility] is not responsible for any missing/lost belongings or valuables.    Notify your doctor if there is any change in your medical condition (cold, fever, infection).  Wear comfortable clothing (specific to your surgery type) to the hospital.  After surgery, you can help prevent  lung complications by doing breathing exercises.  Take deep breaths and cough every 1-2 hours. Your doctor may order a device called an Incentive Spirometer to help you take deep breaths. When coughing or sneezing, hold a pillow firmly against your incision with both hands. This is called "splinting." Doing this helps protect your incision. It also decreases belly discomfort.  If you are being admitted to the  hospital overnight, leave your suitcase in the car. After surgery it may be brought to your room.  In case of increased patient census, it may be necessary for you, the patient, to continue your postoperative care in the Same Day Surgery department.  If you are being discharged the day of surgery, you will not be allowed to drive home. You will need a responsible individual to drive you home and stay with you for 24 hours after surgery.   If you are taking public transportation, you will need to have a responsible individual with you.  Please call the Pre-admissions Testing Dept. at [phone] if you have any questions about these instructions.  Surgery Visitation Policy:  Patients having surgery or a procedure may have two visitors.  Children under the age of 36 must have an adult with them who is not the patient.  Temporary Visitor Restrictions Due to increasing cases of flu, RSV and COVID-19: Children ages 40 and under will not be able to visit patients in [facility] hospitals under most circumstances.  Inpatient Visitation:    Visiting hours are 7 a.m. to 8 p.m. Up to four visitors are allowed at one time in a patient room. The visitors may rotate out with other people during the day.  One visitor age 58 or older may stay with the patient overnight and must be in the room by 8 p.m.    Pre-operative 5 CHG Bath Instructions   You can play a key role in reducing the risk of infection after surgery. Your skin needs to be as free of germs as possible. You can reduce the number of germs on your skin by washing with CHG (chlorhexidine gluconate) soap before surgery. CHG is an antiseptic soap that kills germs and continues to kill germs even after washing.   DO NOT use if you have an allergy to chlorhexidine/CHG or antibacterial soaps. If your skin becomes reddened or irritated, stop using the CHG and notify one of our RNs at [phone].   Please shower with the CHG soap starting  4 days before surgery using the following schedule:     Please keep in mind the following:  DO NOT shave, including legs and underarms, starting the day of your first shower.   You may shave your face at any point before/day of surgery.  Place clean sheets on your bed the day you start using CHG soap. Use a clean washcloth (not used since being washed) for each shower. DO NOT sleep with pets once you start using the CHG.   CHG Shower Instructions:  If you choose to wash your hair and private area, wash first with your normal shampoo/soap.  After you use shampoo/soap, rinse your hair and body thoroughly to remove shampoo/soap residue.  Turn the water OFF and apply about 3 tablespoons (45 ml) of CHG soap to a CLEAN washcloth.  Apply CHG soap ONLY FROM YOUR NECK DOWN TO YOUR TOES (washing for 3-5 minutes)  DO NOT use CHG soap on face, private areas, open wounds, or sores.  Pay special attention to the area  where your surgery is being performed.  If you are having back surgery, having someone wash your back for you may be helpful. Wait 2 minutes after CHG soap is applied, then you may rinse off the CHG soap.  Pat dry with a clean towel  Put on clean clothes/pajamas   If you choose to wear lotion, please use ONLY the CHG-compatible lotions on the back of this paper.     Additional instructions for the day of surgery: DO NOT APPLY any lotions, deodorants, cologne, or perfumes.   Put on clean/comfortable clothes.  Brush your teeth.  Ask your nurse before applying any prescription medications to the skin.      CHG Compatible Lotions   Aveeno Moisturizing lotion  Cetaphil Moisturizing Cream  Cetaphil Moisturizing Lotion  Clairol Herbal Essence Moisturizing Lotion, Dry Skin  Clairol Herbal Essence Moisturizing Lotion, Extra Dry Skin  Clairol Herbal Essence Moisturizing Lotion, Normal Skin  Curel Age Defying Therapeutic Moisturizing Lotion with Alpha Hydroxy  Curel Extreme Care Body  Lotion  Curel Soothing Hands Moisturizing Hand Lotion  Curel Therapeutic Moisturizing Cream, Fragrance-Free  Curel Therapeutic Moisturizing Lotion, Fragrance-Free  Curel Therapeutic Moisturizing Lotion, Original Formula  Eucerin Daily Replenishing Lotion  Eucerin Dry Skin Therapy Plus Alpha Hydroxy Crme  Eucerin Dry Skin Therapy Plus Alpha Hydroxy Lotion  Eucerin Original Crme  Eucerin Original Lotion  Eucerin Plus Crme Eucerin Plus Lotion  Eucerin TriLipid Replenishing Lotion  Keri Anti-Bacterial Hand Lotion  Keri Deep Conditioning Original Lotion Dry Skin Formula Softly Scented  Keri Deep Conditioning Original Lotion, Fragrance Free Sensitive Skin Formula  Keri Lotion Fast Absorbing Fragrance Free Sensitive Skin Formula  Keri Lotion Fast Absorbing Softly Scented Dry Skin Formula  Keri Original Lotion  Keri Skin Renewal Lotion Keri Silky Smooth Lotion  Keri Silky Smooth Sensitive Skin Lotion  Nivea Body Creamy Conditioning Oil  Nivea Body Extra Enriched Lotion  Nivea Body Original Lotion  Nivea Body Sheer Moisturizing Lotion Nivea Crme  Nivea Skin Firming Lotion  NutraDerm 30 Skin Lotion  NutraDerm Skin Lotion  NutraDerm Therapeutic Skin Cream  NutraDerm Therapeutic Skin Lotion  ProShield Protective Hand Cream  Provon moisturizing lotion  How to Use an Incentive Spirometer An incentive spirometer is a tool that measures how well you are filling your lungs with each breath. Learning to take long, deep breaths using this tool can help you keep your lungs clear and active. This may help to reverse or lessen your chance of developing breathing (pulmonary) problems, especially infection. You may be asked to use a spirometer: After a surgery. If you have a lung problem or a history of smoking. After a long period of time when you have been unable to move or be active. If the spirometer includes an indicator to show the highest number that you have reached, your health care  provider or respiratory therapist will help you set a goal. Keep a log of your progress as told by your health care provider. What are the risks? Breathing too quickly may cause dizziness or cause you to pass out. Take your time so you do not get dizzy or light-headed. If you are in pain, you may need to take pain medicine before doing incentive spirometry. It is harder to take a deep breath if you are having pain. How to use your incentive spirometer  Sit up on the edge of your bed or on a chair. Hold the incentive spirometer so that it is in an upright position. Before you use the  spirometer, breathe out normally. Place the mouthpiece in your mouth. Make sure your lips are closed tightly around it. Breathe in slowly and as deeply as you can through your mouth, causing the piston or the ball to rise toward the top of the chamber. Hold your breath for 3-5 seconds, or for as long as possible. If the spirometer includes a coach indicator, use this to guide you in breathing. Slow down your breathing if the indicator goes above the marked areas. Remove the mouthpiece from your mouth and breathe out normally. The piston or ball will return to the bottom of the chamber. Rest for a few seconds, then repeat the steps 10 or more times. Take your time and take a few normal breaths between deep breaths so that you do not get dizzy or light-headed. Do this every 1-2 hours when you are awake. If the spirometer includes a goal marker to show the highest number you have reached (best effort), use this as a goal to work toward during each repetition. After each set of 10 deep breaths, cough a few times. This will help to make sure that your lungs are clear. If you have an incision on your chest or abdomen from surgery, place a pillow or a rolled-up towel firmly against the incision when you cough. This can help to reduce pain while taking deep breaths and coughing. General tips When you are able to get out of  bed: Walk around often. Continue to take deep breaths and cough in order to clear your lungs. Keep using the incentive spirometer until your health care provider says it is okay to stop using it. If you have been in the hospital, you may be told to keep using the spirometer at home. Contact a health care provider if: You are having difficulty using the spirometer. You have trouble using the spirometer as often as instructed. Your pain medicine is not giving enough relief for you to use the spirometer as told. You have a fever. Get help right away if: You develop shortness of breath. You develop a cough with bloody mucus from the lungs. You have fluid or blood coming from an incision site after you cough. Summary An incentive spirometer is a tool that can help you learn to take long, deep breaths to keep your lungs clear and active. You may be asked to use a spirometer after a surgery, if you have a lung problem or a history of smoking, or if you have been inactive for a long period of time. Use your incentive spirometer as instructed every 1-2 hours while you are awake. If you have an incision on your chest or abdomen, place a pillow or a rolled-up towel firmly against your incision when you cough. This will help to reduce pain. Get help right away if you have shortness of breath, you cough up bloody mucus, or blood comes from your incision when you cough. This information is not intended to replace advice given to you by your health care provider. Make sure you discuss any questions you have with your health care provider. Document Revised: 05/24/2023 Document Reviewed: 05/24/2023 Elsevier Patient Education  2024 [employer].

## 2023-08-22 ENCOUNTER — Ambulatory Visit: Admission: RE | Admit: 2023-08-22 | Payer: 59 | Source: Home / Self Care | Admitting: Orthopedic Surgery

## 2023-08-22 ENCOUNTER — Encounter: Admission: RE | Payer: Self-pay | Source: Home / Self Care

## 2023-08-22 DIAGNOSIS — Z01812 Encounter for preprocedural laboratory examination: Secondary | ICD-10-CM

## 2023-08-22 SURGERY — TOTAL HIP ARTHROPLASTY ANTERIOR APPROACH
Anesthesia: Choice | Site: Hip | Laterality: Right

## 2023-09-09 ENCOUNTER — Other Ambulatory Visit: Payer: Self-pay | Admitting: Orthopedic Surgery

## 2023-09-11 ENCOUNTER — Encounter
Admission: RE | Admit: 2023-09-11 | Discharge: 2023-09-11 | Disposition: A | Discharge status: Home / Self Care | Payer: 59 | Source: Ambulatory Visit | Attending: Orthopedic Surgery | Admitting: Orthopedic Surgery

## 2023-09-11 ENCOUNTER — Encounter
Admission: RE | Admit: 2023-09-11 | Discharge: 2023-09-11 | Disposition: A | Discharge status: Home / Self Care | Payer: MEDICAID | Source: Ambulatory Visit | Attending: Orthopedic Surgery | Admitting: Orthopedic Surgery

## 2023-09-11 ENCOUNTER — Other Ambulatory Visit: Payer: Self-pay

## 2023-09-11 VITALS — Ht 75.0 in | Wt 220.0 lb

## 2023-09-11 DIAGNOSIS — Z01818 Encounter for other preprocedural examination: Secondary | ICD-10-CM | POA: Diagnosis present

## 2023-09-11 DIAGNOSIS — Z01812 Encounter for preprocedural laboratory examination: Secondary | ICD-10-CM

## 2023-09-11 HISTORY — DX: Other psychoactive substance abuse, uncomplicated: F19.10

## 2023-09-11 LAB — COMPREHENSIVE METABOLIC PANEL
ALT: 17 U/L (ref 0–44)
AST: 23 U/L (ref 15–41)
Albumin: 4.4 g/dL (ref 3.5–5.0)
Alkaline Phosphatase: 46 U/L (ref 38–126)
Anion gap: 8 (ref 5–15)
BUN: 17 mg/dL (ref 6–20)
CO2: 26 mmol/L (ref 22–32)
Calcium: 8.9 mg/dL (ref 8.9–10.3)
Chloride: 104 mmol/L (ref 98–111)
Creatinine, Ser: 1.1 mg/dL (ref 0.61–1.24)
GFR, Estimated: >60 mL/min (ref >60)
Glucose, Bld: 72 mg/dL (ref 70–99)
Potassium: 3.8 mmol/L (ref 3.5–5.1)
Sodium: 138 mmol/L (ref 135–145)
Total Bilirubin: 1.4 mg/dL — ABNORMAL HIGH (ref 0.0–1.2)
Total Protein: 7.5 g/dL (ref 6.5–8.1)

## 2023-09-11 LAB — TYPE AND SCREEN
ABO/RH(D): O POS
Antibody Screen: NEGATIVE

## 2023-09-11 LAB — SURGICAL PCR SCREEN
MRSA, PCR: NEGATIVE
Staphylococcus aureus: NEGATIVE

## 2023-09-11 NOTE — Patient Instructions (Signed)
 Your procedure is scheduled on: Report to the Registration Desk on the 1st floor of the [facility]. To find out your arrival time, please call [phone] between 1PM - 3PM on: If your arrival time is 6:00 am, do not arrive before that time as the [facility] entrance doors do not open until 6:00 am.  REMEMBER: Instructions that are not followed completely may result in serious medical risk, up to and including death; or upon the discretion of your surgeon and anesthesiologist your surgery may need to be rescheduled.  Do not eat food after midnight the night before surgery.  No gum chewing or hard candies.  You may however, drink CLEAR liquids up to 2 hours before you are scheduled to arrive for your surgery. Do not drink anything within 2 hours of your scheduled arrival time.  Clear liquids include: - water  - apple juice without pulp - gatorade (not RED colors) - black coffee or tea (Do NOT add milk or creamers to the coffee or tea) Do NOT drink anything that is not on this list.   In addition, your doctor has ordered for you to drink the provided:  Ensure Pre-Surgery Clear Carbohydrate Drink  Drinking this carbohydrate drink up to two hours before surgery helps to reduce insulin resistance and improve patient outcomes. Please complete drinking 2 hours before scheduled arrival time.  One week prior to surgery: Stop Anti-inflammatories (NSAIDS) such as Advil, Aleve, Ibuprofen, Motrin, Naproxen, Naprosyn and Aspirin based products such as Excedrin, Goody's Powder, BC Powder. Stop ANY OVER THE COUNTER supplements until after surgery.  You may however, continue to take Tylenol if needed for pain up until the day of surgery.  Continue taking all of your other prescription medications up until the day of surgery.  ON THE DAY OF SURGERY ONLY TAKE THESE MEDICATIONS WITH SIPS OF WATER:  omeprazole (PRILOSEC)   No Alcohol for 24 hours before or after surgery.  No Smoking  including e-cigarettes for 24 hours before surgery.  No chewable tobacco products for at least 6 hours before surgery.  No nicotine patches on the day of surgery.  Do not use any "recreational" drugs for at least a week (preferably 2 weeks) before your surgery.  Please be advised that the combination of cocaine and anesthesia may have negative outcomes, up to and including death. If you test positive for cocaine, your surgery will be cancelled.  On the morning of surgery brush your teeth with toothpaste and water, you may rinse your mouth with mouthwash if you wish. Do not swallow any toothpaste or mouthwash.  Use CHG Soap as directed on instruction sheet.  Do not wear jewelry, make-up, hairpins, clips or nail polish.  For welded (permanent) jewelry: bracelets, anklets, waist bands, etc.  Please have this removed prior to surgery.  If it is not removed, there is a chance that hospital personnel will need to cut it off on the day of surgery.  Do not wear lotions, powders, or perfumes.   Do not shave body hair from the neck down 48 hours before surgery.  Contact lenses, hearing aids and dentures may not be worn into surgery.  Do not bring valuables to the hospital. [facility] is not responsible for any missing/lost belongings or valuables.   Notify your doctor if there is any change in your medical condition (cold, fever, infection).  Wear comfortable clothing (specific to your surgery type) to the hospital.  After surgery, you can help prevent lung complications by  doing breathing exercises.  Take deep breaths and cough every 1-2 hours.   If you are being admitted to the hospital overnight, leave your suitcase in the car. After surgery it may be brought to your room.  In case of increased patient census, it may be necessary for you, the patient, to continue your postoperative care in the Same Day Surgery department.  If you are being discharged the day of surgery, you will not be  allowed to drive home. You will need a responsible individual to drive you home and stay with you for 24 hours after surgery.   If you are taking public transportation, you will need to have a responsible individual with you.  Please call the Pre-admissions Testing Dept. at [phone] if you have any questions about these instructions.  Surgery Visitation Policy:  Patients having surgery or a procedure may have two visitors.  Children under the age of 48 must have an adult with them who is not the patient.  Temporary Visitor Restrictions Due to increasing cases of flu, RSV and COVID-19: Children ages 55 and under will not be able to visit patients in [facility] hospitals under most circumstances.  Inpatient Visitation:    Visiting hours are 7 a.m. to 8 p.m. Up to four visitors are allowed at one time in a patient room. The visitors may rotate out with other people during the day.  One visitor age 65 or older may stay with the patient overnight and must be in the room by 8 p.m.      Pre-operative 5 CHG Bath Instructions   You can play a key role in reducing the risk of infection after surgery. Your skin needs to be as free of germs as possible. You can reduce the number of germs on your skin by washing with CHG (chlorhexidine gluconate) soap before surgery. CHG is an antiseptic soap that kills germs and continues to kill germs even after washing.   DO NOT use if you have an allergy to chlorhexidine/CHG or antibacterial soaps. If your skin becomes reddened or irritated, stop using the CHG and notify one of our RNs at [phone].   Please shower with the CHG soap starting 4 days before surgery using the following schedule:     Please keep in mind the following:  DO NOT shave, including legs and underarms, starting the day of your first shower.   You may shave your face at any point before/day of surgery.  Place clean sheets on your bed the day you start using CHG soap. Use  a clean washcloth (not used since being washed) for each shower. DO NOT sleep with pets once you start using the CHG.   CHG Shower Instructions:  If you choose to wash your hair and private area, wash first with your normal shampoo/soap.  After you use shampoo/soap, rinse your hair and body thoroughly to remove shampoo/soap residue.  Turn the water OFF and apply about 3 tablespoons (45 ml) of CHG soap to a CLEAN washcloth.  Apply CHG soap ONLY FROM YOUR NECK DOWN TO YOUR TOES (washing for 3-5 minutes)  DO NOT use CHG soap on face, private areas, open wounds, or sores.  Pay special attention to the area where your surgery is being performed.  If you are having back surgery, having someone wash your back for you may be helpful. Wait 2 minutes after CHG soap is applied, then you may rinse off the CHG soap.  Pat dry with a clean  towel  Put on clean clothes/pajamas   If you choose to wear lotion, please use ONLY the CHG-compatible lotions on the back of this paper.     Additional instructions for the day of surgery: DO NOT APPLY any lotions, deodorants, cologne, or perfumes.   Put on clean/comfortable clothes.  Brush your teeth.  Ask your nurse before applying any prescription medications to the skin.      CHG Compatible Lotions   Aveeno Moisturizing lotion  Cetaphil Moisturizing Cream  Cetaphil Moisturizing Lotion  Clairol Herbal Essence Moisturizing Lotion, Dry Skin  Clairol Herbal Essence Moisturizing Lotion, Extra Dry Skin  Clairol Herbal Essence Moisturizing Lotion, Normal Skin  Curel Age Defying Therapeutic Moisturizing Lotion with Alpha Hydroxy  Curel Extreme Care Body Lotion  Curel Soothing Hands Moisturizing Hand Lotion  Curel Therapeutic Moisturizing Cream, Fragrance-Free  Curel Therapeutic Moisturizing Lotion, Fragrance-Free  Curel Therapeutic Moisturizing Lotion, Original Formula  Eucerin Daily Replenishing Lotion  Eucerin Dry Skin Therapy Plus Alpha Hydroxy Crme   Eucerin Dry Skin Therapy Plus Alpha Hydroxy Lotion  Eucerin Original Crme  Eucerin Original Lotion  Eucerin Plus Crme Eucerin Plus Lotion  Eucerin TriLipid Replenishing Lotion  Keri Anti-Bacterial Hand Lotion  Keri Deep Conditioning Original Lotion Dry Skin Formula Softly Scented  Keri Deep Conditioning Original Lotion, Fragrance Free Sensitive Skin Formula  Keri Lotion Fast Absorbing Fragrance Free Sensitive Skin Formula  Keri Lotion Fast Absorbing Softly Scented Dry Skin Formula  Keri Original Lotion  Keri Skin Renewal Lotion Keri Silky Smooth Lotion  Keri Silky Smooth Sensitive Skin Lotion  Nivea Body Creamy Conditioning Oil  Nivea Body Extra Enriched Lotion  Nivea Body Original Lotion  Nivea Body Sheer Moisturizing Lotion Nivea Crme  Nivea Skin Firming Lotion  NutraDerm 30 Skin Lotion  NutraDerm Skin Lotion  NutraDerm Therapeutic Skin Cream  NutraDerm Therapeutic Skin Lotion  ProShield Protective Hand Cream  Provon moisturizing lotion         Preoperative Educational Videos for Total Hip, Knee and Shoulder Replacements  To better prepare for surgery, please view our videos that explain the physical activity and discharge planning required to have the best surgical recovery at [facility].  [email or web address]  Questions? Call [phone] or email [email or web address]       How to Use an Incentive Spirometer  An incentive spirometer is a tool that measures how well you are filling your lungs with each breath. Learning to take long, deep breaths using this tool can help you keep your lungs clear and active. This may help to reverse or lessen your chance of developing breathing (pulmonary) problems, especially infection. You may be asked to use a spirometer: After a surgery. If you have a lung problem or a history of smoking. After a long period of time when you have been  unable to move or be active. If the spirometer includes an indicator to show the highest number that you have reached, your health care provider or respiratory therapist will help you set a goal. Keep a log of your progress as told by your health care provider. What are the risks? Breathing too quickly may cause dizziness or cause you to pass out. Take your time so you do not get dizzy or light-headed. If you are in pain, you may need to take pain medicine before doing incentive spirometry. It is harder to take a deep breath if you are having pain. How to use your incentive spirometer  Sit up on the edge of your bed or on  a chair. Hold the incentive spirometer so that it is in an upright position. Before you use the spirometer, breathe out normally. Place the mouthpiece in your mouth. Make sure your lips are closed tightly around it. Breathe in slowly and as deeply as you can through your mouth, causing the piston or the ball to rise toward the top of the chamber. Hold your breath for 3-5 seconds, or for as long as possible. If the spirometer includes a coach indicator, use this to guide you in breathing. Slow down your breathing if the indicator goes above the marked areas. Remove the mouthpiece from your mouth and breathe out normally. The piston or ball will return to the bottom of the chamber. Rest for a few seconds, then repeat the steps 10 or more times. Take your time and take a few normal breaths between deep breaths so that you do not get dizzy or light-headed. Do this every 1-2 hours when you are awake. If the spirometer includes a goal marker to show the highest number you have reached (best effort), use this as a goal to work toward during each repetition. After each set of 10 deep breaths, cough a few times. This will help to make sure that your lungs are clear. If you have an incision on your chest or abdomen from surgery, place a pillow or a rolled-up towel firmly against the  incision when you cough. This can help to reduce pain while taking deep breaths and coughing. General tips When you are able to get out of bed: Walk around often. Continue to take deep breaths and cough in order to clear your lungs. Keep using the incentive spirometer until your health care provider says it is okay to stop using it. If you have been in the hospital, you may be told to keep using the spirometer at home. Contact a health care provider if: You are having difficulty using the spirometer. You have trouble using the spirometer as often as instructed. Your pain medicine is not giving enough relief for you to use the spirometer as told. You have a fever. Get help right away if: You develop shortness of breath. You develop a cough with bloody mucus from the lungs. You have fluid or blood coming from an incision site after you cough. Summary An incentive spirometer is a tool that can help you learn to take long, deep breaths to keep your lungs clear and active. You may be asked to use a spirometer after a surgery, if you have a lung problem or a history of smoking, or if you have been inactive for a long period of time. Use your incentive spirometer as instructed every 1-2 hours while you are awake. If you have an incision on your chest or abdomen, place a pillow or a rolled-up towel firmly against your incision when you cough. This will help to reduce pain. Get help right away if you have shortness of breath, you cough up bloody mucus, or blood comes from your incision when you cough. This information is not intended to replace advice given to you by your health care provider. Make sure you discuss any questions you have with your health care provider. Document Revised: 10/05/2019 Document Reviewed: 10/05/2019 Elsevier Patient Education  2023 [employer].

## 2023-09-12 ENCOUNTER — Encounter
Admission: RE | Disposition: A | Discharge status: Home Health | Payer: Self-pay | Source: Home / Self Care | Attending: Orthopedic Surgery

## 2023-09-12 ENCOUNTER — Ambulatory Visit: Payer: MEDICAID

## 2023-09-12 ENCOUNTER — Ambulatory Visit
Admission: RE | Admit: 2023-09-12 | Discharge: 2023-09-13 | Disposition: A | Discharge status: Home Health | Payer: MEDICAID | Attending: Orthopedic Surgery | Admitting: Orthopedic Surgery

## 2023-09-12 ENCOUNTER — Other Ambulatory Visit: Payer: Self-pay

## 2023-09-12 ENCOUNTER — Encounter: Payer: Self-pay | Admitting: Orthopedic Surgery

## 2023-09-12 ENCOUNTER — Ambulatory Visit: Payer: MEDICAID | Admitting: Anesthesiology

## 2023-09-12 ENCOUNTER — Ambulatory Visit: Payer: MEDICAID | Admitting: Urgent Care

## 2023-09-12 DIAGNOSIS — M879 Osteonecrosis, unspecified: Secondary | ICD-10-CM | POA: Insufficient documentation

## 2023-09-12 DIAGNOSIS — Z96641 Presence of right artificial hip joint: Secondary | ICD-10-CM | POA: Diagnosis present

## 2023-09-12 DIAGNOSIS — Z79899 Other long term (current) drug therapy: Secondary | ICD-10-CM | POA: Diagnosis not present

## 2023-09-12 DIAGNOSIS — Z87891 Personal history of nicotine dependence: Secondary | ICD-10-CM | POA: Insufficient documentation

## 2023-09-12 DIAGNOSIS — K219 Gastro-esophageal reflux disease without esophagitis: Secondary | ICD-10-CM | POA: Insufficient documentation

## 2023-09-12 DIAGNOSIS — F319 Bipolar disorder, unspecified: Secondary | ICD-10-CM | POA: Insufficient documentation

## 2023-09-12 HISTORY — PX: TOTAL HIP ARTHROPLASTY: SHX124

## 2023-09-12 SURGERY — ARTHROPLASTY, HIP, TOTAL, ANTERIOR APPROACH
Anesthesia: General | Site: Hip | Laterality: Right

## 2023-09-12 MED ORDER — PHENOL 1.4 % MT LIQD: 1.0000 | OROMUCOSAL | Status: DC | PRN

## 2023-09-12 MED ORDER — SODIUM CHLORIDE (PF) 0.9 % IJ SOLN
INTRAMUSCULAR | Status: AC
  Filled 2023-09-12: qty 10

## 2023-09-12 MED ORDER — CEFAZOLIN SODIUM-DEXTROSE 2-4 GM/100ML-% IV SOLN
2.0000 g | INTRAVENOUS | Status: AC
Start: 2023-09-12 — End: 2023-09-12
  Administered 2023-09-12: 2 g via INTRAVENOUS

## 2023-09-12 MED ORDER — CEFAZOLIN SODIUM-DEXTROSE 2-4 GM/100ML-% IV SOLN
2.0000 g | Freq: Four times a day (QID) | INTRAVENOUS | Status: AC
  Administered 2023-09-12 (×2): 2 g via INTRAVENOUS
  Filled 2023-09-12 (×2): qty 100

## 2023-09-12 MED ORDER — PROPOFOL 1000 MG/100ML IV EMUL
INTRAVENOUS | Status: AC
  Filled 2023-09-12: qty 100

## 2023-09-12 MED ORDER — BUPIVACAINE-EPINEPHRINE (PF) 0.25% -1:200000 IJ SOLN
INTRAMUSCULAR | Status: AC
  Filled 2023-09-12: qty 90

## 2023-09-12 MED ORDER — ONDANSETRON HCL 4 MG PO TABS: 4.0000 mg | ORAL_TABLET | Freq: Four times a day (QID) | ORAL | Status: DC | PRN

## 2023-09-12 MED ORDER — MORPHINE SULFATE (PF) 2 MG/ML IV SOLN: 0.5000 mg | INTRAVENOUS | Status: DC | PRN

## 2023-09-12 MED ORDER — BUPIVACAINE HCL (PF) 0.5 % IJ SOLN
INTRAMUSCULAR | Status: DC | PRN
  Administered 2023-09-12: 3 mL

## 2023-09-12 MED ORDER — DOCUSATE SODIUM 100 MG PO CAPS
100.0000 mg | ORAL_CAPSULE | Freq: Two times a day (BID) | ORAL | 0 refills | Status: AC
Start: 2023-09-12 — End: ?

## 2023-09-12 MED ORDER — LACTATED RINGERS IV SOLN: INTRAVENOUS | Status: DC

## 2023-09-12 MED ORDER — MENTHOL 3 MG MT LOZG: 1.0000 | LOZENGE | OROMUCOSAL | Status: DC | PRN

## 2023-09-12 MED ORDER — ACETAMINOPHEN 10 MG/ML IV SOLN: 1000.0000 mg | Freq: Once | INTRAVENOUS | Status: DC | PRN

## 2023-09-12 MED ORDER — TRANEXAMIC ACID-NACL 1000-0.7 MG/100ML-% IV SOLN
INTRAVENOUS | Status: AC
  Filled 2023-09-12: qty 100

## 2023-09-12 MED ORDER — SODIUM CHLORIDE 0.9 % IR SOLN
Status: DC | PRN
Start: 2023-09-12 — End: 2023-09-12
  Administered 2023-09-12: 100 mL

## 2023-09-12 MED ORDER — BUPIVACAINE LIPOSOME 1.3 % IJ SUSP
INTRAMUSCULAR | Status: AC
  Filled 2023-09-12: qty 20

## 2023-09-12 MED ORDER — METOCLOPRAMIDE HCL 5 MG PO TABS: 5.0000 mg | ORAL_TABLET | Freq: Three times a day (TID) | ORAL | Status: DC | PRN

## 2023-09-12 MED ORDER — PHENYLEPHRINE HCL-NACL 20-0.9 MG/250ML-% IV SOLN
INTRAVENOUS | Status: DC | PRN
  Administered 2023-09-12: 20 ug/min via INTRAVENOUS

## 2023-09-12 MED ORDER — ACETAMINOPHEN 500 MG PO TABS: 1000.0000 mg | ORAL_TABLET | Freq: Three times a day (TID) | ORAL | 0 refills | Status: AC

## 2023-09-12 MED ORDER — TRANEXAMIC ACID-NACL 1000-0.7 MG/100ML-% IV SOLN
1000.0000 mg | INTRAVENOUS | Status: AC
  Administered 2023-09-12 (×2): 1000 mg via INTRAVENOUS

## 2023-09-12 MED ORDER — CHLORHEXIDINE GLUCONATE 0.12 % MT SOLN
15.0000 mL | Freq: Once | OROMUCOSAL | Status: AC
  Administered 2023-09-12: 15 mL via OROMUCOSAL

## 2023-09-12 MED ORDER — HYDROCODONE-ACETAMINOPHEN 5-325 MG PO TABS
1.0000 | ORAL_TABLET | ORAL | Status: DC | PRN
  Administered 2023-09-12 – 2023-09-13 (×3): 1 via ORAL
  Filled 2023-09-12 (×3): qty 1

## 2023-09-12 MED ORDER — EPHEDRINE SULFATE-NACL 50-0.9 MG/10ML-% IV SOSY
PREFILLED_SYRINGE | INTRAVENOUS | Status: DC | PRN
  Administered 2023-09-12: 5 mg via INTRAVENOUS

## 2023-09-12 MED ORDER — TRAMADOL HCL 50 MG PO TABS: 50.0000 mg | ORAL_TABLET | Freq: Four times a day (QID) | ORAL | 0 refills | Status: AC | PRN

## 2023-09-12 MED ORDER — EPHEDRINE 5 MG/ML INJ
INTRAVENOUS | Status: AC
  Filled 2023-09-12: qty 5

## 2023-09-12 MED ORDER — PANTOPRAZOLE SODIUM 40 MG PO TBEC
40.0000 mg | DELAYED_RELEASE_TABLET | Freq: Every day | ORAL | Status: DC
  Administered 2023-09-13: 40 mg via ORAL
  Filled 2023-09-12: qty 1

## 2023-09-12 MED ORDER — ORAL CARE MOUTH RINSE: 15.0000 mL | Freq: Once | OROMUCOSAL | Status: AC

## 2023-09-12 MED ORDER — ACETAMINOPHEN 500 MG PO TABS
1000.0000 mg | ORAL_TABLET | Freq: Three times a day (TID) | ORAL | Status: DC
  Administered 2023-09-12 (×2): 1000 mg via ORAL
  Filled 2023-09-12 (×3): qty 2

## 2023-09-12 MED ORDER — TRAMADOL HCL 50 MG PO TABS: 50.0000 mg | ORAL_TABLET | Freq: Four times a day (QID) | ORAL | Status: DC | PRN

## 2023-09-12 MED ORDER — SODIUM CHLORIDE (PF) 0.9 % IJ SOLN
INTRAMUSCULAR | Status: DC | PRN
  Administered 2023-09-12: 50 mL via INTRAMUSCULAR

## 2023-09-12 MED ORDER — CELECOXIB 100 MG PO CAPS: 100.0000 mg | ORAL_CAPSULE | Freq: Two times a day (BID) | ORAL | 0 refills | Status: AC

## 2023-09-12 MED ORDER — OXYCODONE HCL 5 MG PO TABS: 5.0000 mg | ORAL_TABLET | Freq: Once | ORAL | Status: DC | PRN

## 2023-09-12 MED ORDER — SODIUM CHLORIDE 0.9 % IV SOLN: INTRAVENOUS | Status: DC

## 2023-09-12 MED ORDER — ONDANSETRON HCL 4 MG/2ML IJ SOLN: 4.0000 mg | Freq: Four times a day (QID) | INTRAMUSCULAR | Status: DC | PRN

## 2023-09-12 MED ORDER — MIDAZOLAM HCL 5 MG/5ML IJ SOLN
INTRAMUSCULAR | Status: DC | PRN
  Administered 2023-09-12: 2 mg via INTRAVENOUS

## 2023-09-12 MED ORDER — ACETAMINOPHEN 10 MG/ML IV SOLN
INTRAVENOUS | Status: DC | PRN
  Administered 2023-09-12: 1000 mg via INTRAVENOUS

## 2023-09-12 MED ORDER — FENTANYL CITRATE (PF) 100 MCG/2ML IJ SOLN: 25.0000 ug | INTRAMUSCULAR | Status: DC | PRN

## 2023-09-12 MED ORDER — ONDANSETRON HCL 4 MG/2ML IJ SOLN
INTRAMUSCULAR | Status: DC | PRN
  Administered 2023-09-12: 4 mg via INTRAVENOUS

## 2023-09-12 MED ORDER — METOCLOPRAMIDE HCL 5 MG/ML IJ SOLN: 5.0000 mg | Freq: Three times a day (TID) | INTRAMUSCULAR | Status: DC | PRN

## 2023-09-12 MED ORDER — ENOXAPARIN SODIUM 40 MG/0.4ML IJ SOSY
40.0000 mg | PREFILLED_SYRINGE | INTRAMUSCULAR | Status: DC
  Administered 2023-09-13: 40 mg via SUBCUTANEOUS
  Filled 2023-09-12: qty 0.4

## 2023-09-12 MED ORDER — 0.9 % SODIUM CHLORIDE (POUR BTL) OPTIME
TOPICAL | Status: DC | PRN
  Administered 2023-09-12: 500 mL

## 2023-09-12 MED ORDER — CEFAZOLIN SODIUM-DEXTROSE 2-4 GM/100ML-% IV SOLN
INTRAVENOUS | Status: AC
  Filled 2023-09-12: qty 100

## 2023-09-12 MED ORDER — ALBUMIN HUMAN 5 % IV SOLN
INTRAVENOUS | Status: DC | PRN
Start: 2023-09-12 — End: 2023-09-12

## 2023-09-12 MED ORDER — BUPIVACAINE HCL (PF) 0.5 % IJ SOLN
INTRAMUSCULAR | Status: AC
  Filled 2023-09-12: qty 10

## 2023-09-12 MED ORDER — FENTANYL CITRATE (PF) 100 MCG/2ML IJ SOLN
INTRAMUSCULAR | Status: AC
Start: 2023-09-12 — End: ?
  Filled 2023-09-12: qty 2

## 2023-09-12 MED ORDER — DEXAMETHASONE SODIUM PHOSPHATE 10 MG/ML IJ SOLN
INTRAMUSCULAR | Status: AC
  Filled 2023-09-12: qty 1

## 2023-09-12 MED ORDER — ENOXAPARIN SODIUM 40 MG/0.4ML IJ SOSY: 40.0000 mg | PREFILLED_SYRINGE | INTRAMUSCULAR | 0 refills | Status: AC

## 2023-09-12 MED ORDER — ACETAMINOPHEN 325 MG PO TABS
325.0000 mg | ORAL_TABLET | Freq: Four times a day (QID) | ORAL | Status: DC | PRN
  Administered 2023-09-13: 325 mg via ORAL
  Filled 2023-09-12: qty 2

## 2023-09-12 MED ORDER — FENTANYL CITRATE (PF) 100 MCG/2ML IJ SOLN
INTRAMUSCULAR | Status: DC | PRN
  Administered 2023-09-12: 100 ug via INTRAVENOUS

## 2023-09-12 MED ORDER — SURGIPHOR WOUND IRRIGATION SYSTEM - OPTIME: TOPICAL | Status: DC | PRN

## 2023-09-12 MED ORDER — ONDANSETRON HCL 4 MG/2ML IJ SOLN
INTRAMUSCULAR | Status: AC
  Filled 2023-09-12: qty 2

## 2023-09-12 MED ORDER — KETOROLAC TROMETHAMINE 15 MG/ML IJ SOLN
7.5000 mg | Freq: Four times a day (QID) | INTRAMUSCULAR | Status: AC
  Administered 2023-09-12 – 2023-09-13 (×4): 7.5 mg via INTRAVENOUS
  Filled 2023-09-12 (×4): qty 1

## 2023-09-12 MED ORDER — OXYCODONE HCL 5 MG PO TABS: 5.0000 mg | ORAL_TABLET | Freq: Four times a day (QID) | ORAL | 0 refills | Status: AC | PRN

## 2023-09-12 MED ORDER — PROPOFOL 500 MG/50ML IV EMUL
INTRAVENOUS | Status: DC | PRN
  Administered 2023-09-12: 50 ug/kg/min via INTRAVENOUS

## 2023-09-12 MED ORDER — ONDANSETRON HCL 4 MG/2ML IJ SOLN: 4.0000 mg | Freq: Once | INTRAMUSCULAR | Status: DC | PRN

## 2023-09-12 MED ORDER — MIDAZOLAM HCL 2 MG/2ML IJ SOLN
INTRAMUSCULAR | Status: AC
Start: 2023-09-12 — End: ?
  Filled 2023-09-12: qty 2

## 2023-09-12 MED ORDER — OXYCODONE HCL 5 MG/5ML PO SOLN: 5.0000 mg | Freq: Once | ORAL | Status: DC | PRN

## 2023-09-12 MED ORDER — DEXAMETHASONE SODIUM PHOSPHATE 10 MG/ML IJ SOLN
8.0000 mg | Freq: Once | INTRAMUSCULAR | Status: AC
Start: 2023-09-12 — End: 2023-09-12
  Administered 2023-09-12: 8 mg via INTRAVENOUS

## 2023-09-12 MED ORDER — SODIUM CHLORIDE (PF) 0.9 % IJ SOLN
INTRAMUSCULAR | Status: AC
  Filled 2023-09-12: qty 40

## 2023-09-12 MED ORDER — CHLORHEXIDINE GLUCONATE 0.12 % MT SOLN
OROMUCOSAL | Status: AC
  Filled 2023-09-12: qty 15

## 2023-09-12 MED ORDER — PHENYLEPHRINE 80 MCG/ML (10ML) SYRINGE FOR IV PUSH (FOR BLOOD PRESSURE SUPPORT)
PREFILLED_SYRINGE | INTRAVENOUS | Status: DC | PRN
  Administered 2023-09-12 (×4): 80 ug via INTRAVENOUS

## 2023-09-12 MED ORDER — ONDANSETRON HCL 4 MG PO TABS: 4.0000 mg | ORAL_TABLET | Freq: Four times a day (QID) | ORAL | 0 refills | Status: AC | PRN

## 2023-09-12 MED ORDER — DOCUSATE SODIUM 100 MG PO CAPS
100.0000 mg | ORAL_CAPSULE | Freq: Two times a day (BID) | ORAL | Status: DC
  Administered 2023-09-12 – 2023-09-13 (×2): 100 mg via ORAL
  Filled 2023-09-12 (×2): qty 1

## 2023-09-12 MED ORDER — ACETAMINOPHEN 10 MG/ML IV SOLN
INTRAVENOUS | Status: AC
  Filled 2023-09-12: qty 100

## 2023-09-12 MED ORDER — PHENYLEPHRINE HCL-NACL 20-0.9 MG/250ML-% IV SOLN
INTRAVENOUS | Status: AC
Start: 2023-09-12 — End: ?
  Filled 2023-09-12: qty 250

## 2023-09-12 MED ORDER — PHENYLEPHRINE 80 MCG/ML (10ML) SYRINGE FOR IV PUSH (FOR BLOOD PRESSURE SUPPORT)
PREFILLED_SYRINGE | INTRAVENOUS | Status: AC
  Filled 2023-09-12: qty 10

## 2023-09-12 SURGICAL SUPPLY — 63 items
BLADE CLIPPER SURG (BLADE) IMPLANT
BLADE SAGITTAL AGGR TOOTH XLG (BLADE) ×1 IMPLANT
BNDG COHESIVE 6X5 TAN ST LF (GAUZE/BANDAGES/DRESSINGS) ×1 IMPLANT
BRUSH SCRUB EZ PLAIN DRY (MISCELLANEOUS) ×1 IMPLANT
CHLORAPREP W/TINT 26 (MISCELLANEOUS) ×1 IMPLANT
DERMABOND ADVANCED .7 DNX12 (GAUZE/BANDAGES/DRESSINGS) ×1 IMPLANT
DRAPE C-ARM XRAY 36X54 (DRAPES) ×1 IMPLANT
DRAPE SHEET LG 3/4 BI-LAMINATE (DRAPES) ×3 IMPLANT
DRAPE TABLE BACK 80X90 (DRAPES) ×1 IMPLANT
DRSG MEPILEX SACRM 8.7X9.8 (GAUZE/BANDAGES/DRESSINGS) ×1 IMPLANT
DRSG OPSITE POSTOP 4X8 (GAUZE/BANDAGES/DRESSINGS) ×1 IMPLANT
ELECT BLADE 4.0 EZ CLEAN MEGAD (MISCELLANEOUS) ×1 IMPLANT
ELECT REM PT RETURN 9FT ADLT (ELECTROSURGICAL) ×1 IMPLANT
ELECTRODE BLDE 4.0 EZ CLN MEGD (MISCELLANEOUS) ×1 IMPLANT
ELECTRODE REM PT RTRN 9FT ADLT (ELECTROSURGICAL) ×1 IMPLANT
GLOVE BIO SURGEON STRL SZ8 (GLOVE) ×1 IMPLANT
GLOVE BIOGEL PI IND STRL 8 (GLOVE) ×1 IMPLANT
GLOVE PI ORTHO PRO STRL 7.5 (GLOVE) ×2 IMPLANT
GLOVE PI ORTHO PRO STRL SZ8 (GLOVE) ×2 IMPLANT
GLOVE SURG SYN 7.5 E (GLOVE) ×1 IMPLANT
GLOVE SURG SYN 7.5 PF PI (GLOVE) ×1 IMPLANT
GOWN SRG XL LVL 3 NONREINFORCE (GOWNS) ×1 IMPLANT
GOWN STRL REUS W/ TWL LRG LVL3 (GOWN DISPOSABLE) ×1 IMPLANT
GOWN STRL REUS W/ TWL XL LVL3 (GOWN DISPOSABLE) ×1 IMPLANT
HANDLE YANKAUER SUCT OPEN TIP (MISCELLANEOUS) IMPLANT
HEAD FEM TAPER UNIV 4 (Orthopedic Implant) IMPLANT
HOOD PEEL AWAY T7 (MISCELLANEOUS) ×2 IMPLANT
INSERT 0D POLY 40 SZ F (Insert) IMPLANT
IV NS 100ML SINGLE PACK (IV SOLUTION) ×1 IMPLANT
KIT PATIENT CARE HANA TABLE (KITS) ×1 IMPLANT
LIGHT WAVEGUIDE WIDE FLAT (MISCELLANEOUS) ×1 IMPLANT
MANIFOLD NEPTUNE II (INSTRUMENTS) ×1 IMPLANT
MARKER SKIN DUAL TIP RULER LAB (MISCELLANEOUS) ×1 IMPLANT
MAT ABSORB FLUID 56X50 GRAY (MISCELLANEOUS) ×1 IMPLANT
NDL SPNL 20GX3.5 QUINCKE YW (NEEDLE) ×1 IMPLANT
NEEDLE SPNL 20GX3.5 QUINCKE YW (NEEDLE) ×1 IMPLANT
NS IRRIG 500ML POUR BTL (IV SOLUTION) ×1 IMPLANT
PACK HIP COMPR (MISCELLANEOUS) ×1 IMPLANT
PAD ARMBOARD 7.5X6 YLW CONV (MISCELLANEOUS) ×1 IMPLANT
PENCIL SMOKE EVACUATOR (MISCELLANEOUS) ×1 IMPLANT
SCREW HEX LP 6.5X25 (Screw) IMPLANT
SCREW HEX LP 6.5X30 (Screw) IMPLANT
SHELL ACETAB TRIDENT 58 (Shell) IMPLANT
SLEEVE FEM ADAPTER V-40 +0 (Sleeve) IMPLANT
SLEEVE SCD COMPRESS KNEE MED (STOCKING) ×1 IMPLANT
SOLUTION IRRIG SURGIPHOR (IV SOLUTION) ×1 IMPLANT
STEM HIGH OFFSET SZ6 38.5X107 (Stem) IMPLANT
SURGIFLO W/THROMBIN 8M KIT (HEMOSTASIS) IMPLANT
SUT BONE WAX W31G (SUTURE) ×1 IMPLANT
SUT ETHIBOND 2 V 37 (SUTURE) ×1 IMPLANT
SUT SILK 0 30XBRD TIE 6 (SUTURE) ×1 IMPLANT
SUT STRATA 1 CT-1 DLB (SUTURE) ×1 IMPLANT
SUT STRATAFIX 14 PDO 48 VLT (SUTURE) ×1 IMPLANT
SUT STRATAFIX PDO 1 14 VIOLET (SUTURE) ×1 IMPLANT
SUT VIC AB 0 CT1 36 (SUTURE) ×1 IMPLANT
SUT VIC AB 2-0 CT2 27 (SUTURE) ×1 IMPLANT
SUTURE STRATA SPIR 4-0 18 (SUTURE) ×1 IMPLANT
SYR 30ML LL (SYRINGE) ×2 IMPLANT
TAPE MICROFOAM 4IN (TAPE) IMPLANT
TOWEL OR 17X26 4PK STRL BLUE (TOWEL DISPOSABLE) IMPLANT
TRAP FLUID SMOKE EVACUATOR (MISCELLANEOUS) ×1 IMPLANT
WAND WEREWOLF FASTSEAL 6.0 (MISCELLANEOUS) ×1 IMPLANT
WATER STERILE IRR 1000ML POUR (IV SOLUTION) ×1 IMPLANT

## 2023-09-12 NOTE — Transfer of Care (Signed)
Immediate Anesthesia Transfer of Care Note  Patient: Charles Garrison  Procedure(s) Performed: TOTAL HIP ARTHROPLASTY ANTERIOR APPROACH (Right: Hip)  Patient Location: PACU  Anesthesia Type:Spinal  Level of Consciousness: awake and alert   Airway & Oxygen Therapy: Patient Spontanous Breathing  Post-op Assessment: Report given to RN and Post -op Vital signs reviewed and stable  Post vital signs: Reviewed and stable  Last Vitals:  Vitals Value Taken Time  BP 99/71 09/12/23 0951  Temp 36.4 C 09/12/23 0945  Pulse 68 09/12/23 0953  Resp 14 09/12/23 0953  SpO2 95 % 09/12/23 0953  Vitals shown include unfiled device data.  Last Pain:  Vitals:   09/12/23 0945  TempSrc:   PainSc: 0-No pain      Patients Stated Pain Goal: 0 (09/12/23 8295)  Complications: No notable events documented.

## 2023-09-12 NOTE — Interval H&P Note (Signed)
Patient history and physical updated. Consent reviewed including risks, benefits, and alternatives to surgery. Patient agrees with above plan to proceed with right anterior total hip arthroplasty.

## 2023-09-12 NOTE — Evaluation (Signed)
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  Clinical Impression  Patient is agreeable to PT evaluation. He was intermittently using crutches prior to admission. He lives with a significant other and son.  Today the patient was able to stand with supervision from the bed. Gait training progressed into hallway with occasional cues for proper use of rolling walker. No dizziness with mobility and no reported increased pain. The patient is hopeful to discharge home tomorrow. PT will continue to follow to maximize independence.       If plan is discharge home, recommend the following: Help with stairs or ramp for entrance;Assist for transportation   Can travel by private vehicle        Equipment Recommendations Rolling walker (2 wheels);BSC/3in1  Recommendations for Other Services       Functional Status Assessment Patient has had a recent decline in their functional status and demonstrates the ability to make significant improvements in function in a reasonable and predictable amount of time.     Precautions / Restrictions Precautions Precautions: Fall;Anterior Hip Precaution Booklet Issued: Yes (comment) Restrictions Weight Bearing Restrictions Per Provider Order: Yes RLE Weight Bearing Per Provider Order: Weight bearing as tolerated      Mobility  Bed Mobility Overal bed mobility: Needs Assistance Bed Mobility: Supine to Sit, Sit to Supine     Supine to sit: Supervision Sit to supine: Contact guard assist        Transfers Overall transfer level: Needs assistance Equipment used: Rolling walker (2 wheels) Transfers: Sit to/from Stand Sit to Stand: Supervision           General transfer comment: cues for technique    Ambulation/Gait Ambulation/Gait assistance:  Contact guard assist Gait Distance (Feet): 115 Feet Assistive device: Rolling walker (2 wheels) Gait Pattern/deviations: Step-through pattern, Decreased stance time - right Gait velocity: decreased     General Gait Details: verbal cues for keeping rolling walker grounded instead of picking up with each step. no loss of balance  Stairs            Wheelchair Mobility     Tilt Bed    Modified Rankin (Stroke Patients Only)       Balance Overall balance assessment: Needs assistance Sitting-balance support: Feet supported Sitting balance-Leahy Scale: Good     Standing balance support: Bilateral upper extremity supported Standing balance-Leahy Scale: Fair Standing balance comment: with rolling walker for support in standing                             Pertinent Vitals/Pain Pain Assessment Pain Assessment: Faces Faces Pain Scale: Hurts a little bit Pain Location: R hip Pain Descriptors / Indicators: Discomfort Pain Intervention(s): Limited activity within patient's tolerance, Monitored during session, Repositioned (encouraged ice pack)    Home Living Family/patient expects to be discharged to:: Private residence Living Arrangements: Spouse/significant other (and son) Available Help at Discharge: Family Type of Home: House Home Access: Stairs to enter Entrance Stairs-Rails: Right;Left;Can reach both Entrance Stairs-Number of Steps: 2   Home Layout: One level (has several steps to get to the dining room but not required) Home Equipment: Crutches;Shower seat      Prior Function Prior Level of Function : Independent/Modified Independent;Working/employed;Driving  Mobility Comments: Mod I, crutches intermittently ADLs Comments: sits down for showering due to chronic hip pain, Mod I     Extremity/Trunk Assessment   Upper Extremity Assessment Upper Extremity Assessment: Overall WFL for tasks assessed    Lower Extremity  Assessment Lower Extremity Assessment: RLE deficits/detail RLE Deficits / Details: patient able to weight bear without knee buckling. RLE Sensation: WNL       Communication   Communication Communication: No apparent difficulties    Cognition Arousal: Alert Behavior During Therapy: WFL for tasks assessed/performed   PT - Cognitive impairments: No apparent impairments                         Following commands: Intact       Cueing       General Comments      Exercises     Assessment/Plan    PT Assessment Patient needs continued PT services  PT Problem List Decreased strength;Decreased activity tolerance;Decreased balance;Decreased mobility;Decreased range of motion;Decreased knowledge of use of DME       PT Treatment Interventions DME instruction;Gait training;Stair training;Functional mobility training;Therapeutic activities;Therapeutic exercise;Balance training    PT Goals (Current goals can be found in the Care Plan section)  Acute Rehab PT Goals Patient Stated Goal: to go home tomorrow PT Goal Formulation: With patient Time For Goal Achievement: 09/26/23 Potential to Achieve Goals: Good    Frequency BID     Co-evaluation               AM-PAC PT "6 Clicks" Mobility  Outcome Measure Help needed turning from your back to your side while in a flat bed without using bedrails?: A Little Help needed moving from lying on your back to sitting on the side of a flat bed without using bedrails?: A Little Help needed moving to and from a bed to a chair (including a wheelchair)?: A Little Help needed standing up from a chair using your arms (e.g., wheelchair or bedside chair)?: A Little Help needed to walk in hospital room?: A Little Help needed climbing 3-5 steps with a railing? : A Little 6 Click Score: 18    End of Session Equipment Utilized During Treatment: Gait belt Activity Tolerance: Patient tolerated treatment well Patient left: in  bed;with call bell/phone within reach;with SCD's reapplied;with family/visitor present (ice pack R hip) Nurse Communication: Mobility status PT Visit Diagnosis: Difficulty in walking, not elsewhere classified (R26.2);Other abnormalities of gait and mobility (R26.89)    Time: 4098-1191 PT Time Calculation (min) (ACUTE ONLY): 30 min   Charges:   PT Evaluation $PT Eval Low Complexity: 1 Low PT Treatments $Gait Training: 8-22 mins PT General Charges $$ ACUTE PT VISIT: 1 Visit        Donna Bernard, PT, MPT   Ina Homes 09/12/2023, 3:33 PM

## 2023-09-12 NOTE — Plan of Care (Signed)
  Problem: Education: Goal: Knowledge of General Education information will improve Description: Including pain rating scale, medication(s)/side effects and non-pharmacologic comfort measures Outcome: Progressing   Problem: Clinical Measurements: Goal: Ability to maintain clinical measurements within normal limits will improve Outcome: Progressing   Problem: Clinical Measurements: Goal: Will remain free from infection Outcome: Progressing   Problem: Activity: Goal: Risk for activity intolerance will decrease Outcome: Progressing   Problem: Pain Managment: Goal: General experience of comfort will improve and/or be controlled Outcome: Progressing

## 2023-09-12 NOTE — Discharge Instructions (Signed)

## 2023-09-12 NOTE — Op Note (Signed)
Patient Name: Charles Garrison  FAO:[id number]  Pre-Operative Diagnosis: Right hip avascular necrosis  Post-Operative Diagnosis: (same)  Procedure: Right Total Hip Arthroplasty  Components/Implants: Cup: Trident Tritanium Clusterhole 58/F w/x2 screws    Liner: neutral X3 poly 40/F  Stem: Insignia #6 high offset  Head:biolox ceramic 40mm head with +51mm taper adapter sleeve  Date of Surgery: 09/12/2023  Surgeon: Reinaldo Berber MD  Assistant: Amador Cunas PA (present and scrubbed throughout the case, critical for assistance with exposure, retraction, instrumentation, and closure)   Anesthesiologist: Mazzoni  Anesthesia: Spinal   EBL: 150cc  IVF:800cc  Complications: None   Brief history: The patient is a 53 year old male with a history of avascular necrosis of the right hip with pain limiting their range of motion and activities of daily living, which has failed multiple attempts at conservative therapy.  The risks and benefits of total hip arthroplasty as definitive surgical treatment were discussed with the patient, who opted to proceed with the operation.  After outpatient medical clearance and optimization was completed the patient was admitted to [facility] for the procedure.  All preoperative films were reviewed and an appropriate surgical plan was made prior to surgery.   Description of procedure: The patient was brought to the operating room where laterality was confirmed by all those present to be the right side.  The patient was administered spinal anesthesia on a stretcher prior to being moved supine on the operating room table. Patient was given an intravenous dose of antibiotics for surgical prophylaxis and TXA.  All bony prominences and extremities were well padded and the patient was securely attached to the table boots, a perineal post was placed and the patient had a safety strap placed.  Surgical site was prepped with alcohol and chlorhexidine. The  surgical site over the hip was and draped in typical sterile fashion with multiple layers of adhesive and nonadhesive drapes.  The incision site was marked out with a sterile marker and care was taken to assess the position of the ASIS and ensure appropriate position for the incision.    A surgical timeout was then called with participation of all staff in the room the patient was then a confirmed again and laterality confirmed.  Incision was made over the anterior lateral aspect of the proximal thigh in line with the TFL.  Appropriate retractors were placed and all bleeding vessels were coagulated within the subcutaneous and fatty layers.  An incision was made in the TFL fascia in the interval was carefully identified.  The lateral ascending branches of the circumflex vessels were identified, cauterized and carefully dissected. The main vessels were then tied with a 0 silk hand tie.  Retractors were placed around the superior lateral and inferior medial aspects of the femoral neck and a capsulotomy was performed exposing the hip joint.  Retraction stitches were placed and the capsulotomy to assist with visualization.  Femoral neck cut was then made and the femoral head was extracted after placing the leg in traction.  Bone wax was then applied to the proximal cut surface of the femur and aqua mantis was used to address any bleeding around the femoral neck cut.  Retractors were then placed around the acetabulum to fully visualize the joint space, and the remaining labral tissue was removed and pulvinar was removed.   The acetabulum was then sequentially reamed up to the appropriate size in order to get good fit and fill for the acetabular component while under fluoroscopic guidance.  Acetabular component  was then placed and malleted into a secure fit while confirming position and abduction angle and anteversion utilizing fluoroscopy.  2 screws were then placed in the acetabular cup to assist in securing the cup  in place. The cup was irrigated,  a real neutral liner was placed, impacted, and checked for stability. The femur traction was dropped and sequentially externally rotated while performing a release of the posterior and superomedial tissues off of the proximal femur to allow for mobility, care was taken to preserve the external rotators and piriformis attachments.  The remaining interval between the abductors and the capsule was dissected out and a retractor was placed over the superolateral aspect of the femur over the greater trochanter.  The leg was carefully brought down into extension and adducted to provide visualization of the proximal femur for broaching.  The femur was then sequentially broached up to an appropriate size which provided for good fill and stability to the femoral broach.  A trial neck and head were placed on the femoral broach and the leg was brought up for reduction.  The hip was reduced and manual check of stability was performed.  The hip was found to be stable in flexion internal rotation and extension external rotation.  Leg lengths were confirmed on fluoroscopy.   The hip was then dislocated the trial neck and head were removed.  The leg was then brought down into extension and adduction in the proximal femur was reexposed.  The broach trial was removed and the femur was irrigated with normal saline prior to the real femoral stem being implanted.  After the femoral stem was seated and shown to have good fit and fill the appropriate head was impacted the leg was brought up and reduced.  There was good range of motion with stability in flexion internal rotation and extension external rotation on testing.  Leg lengths were found to be appropriate on fluoroscopic evaluation at this time.  The hip was then irrigated with betdine based surgiphor solution and then saline solution.  The capsulotomy was repaired with Ethibond sutures.  A pericapsular and peritrochanteric cocktail with Exparel  and bupivacaine was then injected as well as the subcutaneous tissues. The fascia was closed with a #1 barbed running suture.  The deep tissues were closed with Vicryl sutures the subcutaneous tissues were closed with interrupted Vicryl sutures and a running barbed 4-0 suture.  The skin was then reinforced with Dermabond and a sterile dressing was placed.   The patient was awoken from anesthesia transferred off of the operating room table onto a hospital bed where examination of leg lengths found the leg lengths to be equal with a good distal pulse.  The patient was then transferred to the PACU in stable condition.

## 2023-09-12 NOTE — Plan of Care (Signed)
  Problem: Coping: Goal: Level of anxiety will decrease Outcome: Progressing   Problem: Elimination: Goal: Will not experience complications related to bowel motility Outcome: Progressing   Problem: Pain Managment: Goal: General experience of comfort will improve and/or be controlled Outcome: Progressing   Problem: Safety: Goal: Ability to remain free from injury will improve Outcome: Progressing   Problem: Skin Integrity: Goal: Risk for impaired skin integrity will decrease Outcome: Progressing

## 2023-09-12 NOTE — H&P (Signed)
History of Present Illness: Charles Garrison is an 53 y.o. male is here today for history and physical for right anterior total hip arthroplasty with Dr. Audelia Acton. Patient has developed severe increasing pain in his right groin, anterior thigh and lateral hip. Pain has been increasing over the last year. He underwent MRI of the right hip showing avascular necrosis. We treated him partial weightbearing and with alendronate for 6 weeks with no improvement in pain or functioning. Patient's pain is interfering with his quality of life and activities daily living. He has not been able to work since September 2024. He describes sharp stabbing pain with any type of weightbearing on the right lower extremity. No back pain numbness tingling or radicular symptoms. He has been taken occasional naproxen and Norco. He gets most of his relief with using crutches and not putting any weight on the right hip.  Patient does not smoke cigarettes, has a BMI of 27.3, and is a nondiabetic.  Past Medical History: No past medical history on file.  Past Surgical History: Past Surgical History:  Procedure Laterality Date  neck surgery 2015   Past Family History: No family history on file.  Medications: Current Outpatient Medications  Medication Sig Dispense Refill  alendronate (FOSAMAX) 10 MG tablet Take 1 tablet (10 mg total) by mouth every morning before breakfast Take on an empty stomach with a full glass of water. Avoid mineral or well water. Do not eat or take other medications for at least 30 minutes after dose. Sit or stand for at least 30 minutes after dose. 30 tablet 11  HYDROcodone-acetaminophen (NORCO) 5-325 mg tablet Take one tablet at night for pain; may take up to every 6 hours as needed for pain if not working or driving 20 tablet 0  naproxen (NAPROSYN) 500 MG tablet Take 1 tablet (500 mg total) by mouth 2 (two) times daily as needed (for hip pain) 60 tablet 0  omeprazole (PRILOSEC OTC) 20 MG EC tablet  Take 20 mg by mouth once daily  valACYclovir (VALTREX) 500 MG tablet TAKE 1 TABLET BY MOUTH ONCE DAILY FOR PREVENTION. IF YOU HAVE A FLARE, TAKE 1 TABLET TWICE DAILY FOR 3 DAYS   No current facility-administered medications for this visit.   Allergies: No Known Allergies   Visit Vitals: Vitals:  07/16/23 0820  BP: 118/78    Review of Systems:  A comprehensive 14 point ROS was performed, reviewed, and the pertinent orthopaedic findings are documented in the HPI.  Physical Exam: Body mass index is 27.25 kg/m. General:  Well developed, well nourished, no apparent distress, antalgic gait with crutches  HEENT: Head normocephalic, atraumatic, PERRL.   Abdomen: Soft, non tender, non distended, Bowel sounds present.  Heart: Examination of the heart reveals regular, rate, and rhythm. There is no murmur noted on ascultation. There is a normal apical pulse.  Lungs: Lungs are clear to auscultation. There is no wheeze, rhonchi, or crackles. There is normal expansion of bilateral chest walls.   Right hip exam  SKIN: intact SWELLING: none WARMTH: no warmth TENDERNESS: none, Stinchfield Positive ROM: 0 degrees internal rotation and 20 degrees external rotation and pain with internal rotation,; Hip Flexion 80 STRENGTH: limited by pain GAIT: antalgic with crutches STABILITY: stable to testing CREPITUS: no LEG LENGTH DISCREPANCY: none NEUROLOGICAL EXAM: normal VASCULAR EXAM: normal LUMBAR SPINE: tenderness: no straight leg raising sign: no motor exam: normal  The contralateral hip was examined for comparison and it showed: TENDERNESS: none ROM: normal and full STRENGTH: normal  STABILITY: stable to testing  Hip Imaging :  I have reviewed AP pelvis and lateral hip X-rays (2 views) taken today in the office of the right hip which reveal menstruation of the cystic changes within the right femoral head and serpiginous sclerotic areas grossly unchanged from prior films with no  evidence of collapse at this time. There is some medial and inferior joint space narrowing with sclerosis. Left hip unchanged with some sclerotic foci no evidence of collapse.   MRI of the right hip without contrast performed on 01/08/2023 images and report reviewed by myself. There is evidence of avascular necrosis of both femoral heads with minimal to no edema in the left head with some more significant marrow edema throughout the right femoral head and neck with a small intraosseous cyst within the femoral head. No evidence of any subchondral collapse or fractures. There is mild thinning of the cartilage on both the femoral head and the acetabulum on the right with no obvious labral injury. Agree with radiologist interpretation.   Assessment:  Encounter Diagnosis  Name Primary?  Avascular necrosis of bone of right hip (CMS/HHS-HCC) Yes   Plan: Charles Garrison is a 53 year old male with avascular necrosis of his right femoral head with worsening arthritic changes on x-ray. Patient has severe increasing pain throughout the right hip. Pain is interfering with quality of life and activities of daily living. Patient has been unable to return to work since September 2024 due to severe pain and unable to put any weight on the right leg. Risks, benefits, complications of a right anterior total hip arthroplasty have been discussed with the patient. Patient has agreed and signed the procedure with Dr. Audelia Acton on August 08, 2023.  The hospitalization and post-operative care and rehabilitation were also discussed. The use of perioperative antibiotics and DVT prophylaxis were discussed. The risk, benefits and alternatives to a surgical intervention were discussed at length with the patient. The patient was also advised of risks related to the medical comorbidities and elevated body mass index (BMI). A lengthy discussion took place to review the most common complications including but not limited to: deep vein thrombosis,  pulmonary embolus, heart attack, stroke, infection, wound breakdown, heterotopic ossification, dislocation, numbness, leg length in-equality, intraoperative fracture, damage to nerves, tendon,muscles, arteries or other blood vessels, death and other possible complications from anesthesia. The patient was told that we will take steps to minimize these risks by using sterile technique, antibiotics and DVT prophylaxis when appropriate and follow the patient postoperatively in the office setting to monitor progress. The possibility of recurrent pain, no improvement in pain and actual worsening of pain were also discussed with the patient. The risk of dislocation following total hip replacement was discussed and potential precautions to prevent dislocation were reviewed. Specific conversation was had about his young age and the possible need for revision during his life.   All questions answered and patient agrees with the above plan to proceed with right anterior total hip arthroplasty.

## 2023-09-12 NOTE — Anesthesia Postprocedure Evaluation (Signed)
Anesthesia Post Note  Patient: Charles Garrison  Procedure(s) Performed: TOTAL HIP ARTHROPLASTY ANTERIOR APPROACH (Right: Hip)  Patient location during evaluation: PACU Anesthesia Type: Spinal Level of consciousness: oriented and awake and alert Pain management: pain level controlled Vital Signs Assessment: post-procedure vital signs reviewed and stable Respiratory status: spontaneous breathing, respiratory function stable and patient connected to nasal cannula oxygen Cardiovascular status: blood pressure returned to baseline and stable Postop Assessment: no headache, no backache and no apparent nausea or vomiting Anesthetic complications: no   No notable events documented.   Last Vitals:  Vitals:   09/12/23 1030 09/12/23 1045  BP: 105/75 110/79  Pulse: (!) 59 (!) 56  Resp: 19 15  Temp:    SpO2: 99% 100%    Last Pain:  Vitals:   09/12/23 1015  TempSrc:   PainSc: 0-No pain                 Reed Breech

## 2023-09-12 NOTE — Anesthesia Preprocedure Evaluation (Addendum)
Anesthesia Evaluation  Patient identified by MRN, date of birth, ID band Patient awake    Reviewed: Allergy & Precautions, NPO status , Patient's Chart, lab work & pertinent test results  History of Anesthesia Complications Negative for: history of anesthetic complications  Airway Mallampati: IV   Neck ROM: Full    Dental  (+) Missing   Pulmonary Current Smoker (1 per day) and Patient abstained from smoking.   Pulmonary exam normal breath sounds clear to auscultation       Cardiovascular Normal cardiovascular exam Rhythm:Regular Rate:Normal  ECG 08/01/23: normal   Neuro/Psych  PSYCHIATRIC DISORDERS  Depression Bipolar Disorder   Cocaine and marijuana use; denies cocaine, occasional marijuana    GI/Hepatic ,GERD  ,,  Endo/Other  negative endocrine ROS    Renal/GU negative Renal ROS     Musculoskeletal   Abdominal   Peds  Hematology negative hematology ROS (+)   Anesthesia Other Findings   Reproductive/Obstetrics                             Anesthesia Physical Anesthesia Plan  ASA: 2  Anesthesia Plan: General and Spinal   Post-op Pain Management:    Induction: Intravenous  PONV Risk Score and Plan: 1 and Propofol infusion, TIVA, Treatment may vary due to age or medical condition and Ondansetron  Airway Management Planned: Natural Airway and Nasal Cannula  Additional Equipment:   Intra-op Plan:   Post-operative Plan:   Informed Consent: I have reviewed the patients History and Physical, chart, labs and discussed the procedure including the risks, benefits and alternatives for the proposed anesthesia with the patient or authorized representative who has indicated his/her understanding and acceptance.       Plan Discussed with: CRNA  Anesthesia Plan Comments: (Plan for spinal and GA with natural airway, LMA/GETA backup.  Patient consented for risks of anesthesia including but  not limited to:  - adverse reactions to medications - damage to eyes, teeth, lips or other oral mucosa - nerve damage due to positioning  - sore throat or hoarseness - headache, bleeding, infection, nerve damage 2/2 spinal - damage to heart, brain, nerves, lungs, other parts of body or loss of life  Informed patient about role of CRNA in peri- and intra-operative care.  Patient voiced understanding.)        Anesthesia Quick Evaluation
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
## 2023-09-12 NOTE — Progress Notes (Signed)
Patient friend Tawanna Cooler updated with room number

## 2023-09-12 NOTE — Anesthesia Procedure Notes (Signed)
Spinal  Patient location during procedure: OR End time: 09/12/2023 7:38 AM Reason for block: surgical anesthesia Staffing Performed: resident/CRNA  Anesthesiologist: Reed Breech, MD Resident/CRNA: Elisabeth Pigeon, CRNA Performed by: Elisabeth Pigeon, CRNA Authorized by: Reed Breech, MD   Preanesthetic Checklist Completed: patient identified, IV checked, site marked, risks and benefits discussed, surgical consent, monitors and equipment checked, pre-op evaluation and timeout performed Spinal Block Patient position: sitting Prep: ChloraPrep Patient monitoring: heart rate, cardiac monitor, continuous pulse ox and blood pressure Approach: midline Location: L3-4 Injection technique: single-shot Needle Needle type: Pencan  Needle gauge: 24 G Needle length: 10 cm Assessment Sensory level: T4 Events: CSF return Additional Notes Negative paresthesia. Negative blood return. Positive free-flowing CSF. Expiration date of kit checked and confirmed. Patient tolerated procedure well, without complications.

## 2023-09-13 ENCOUNTER — Encounter: Payer: Self-pay | Admitting: Orthopedic Surgery

## 2023-09-13 DIAGNOSIS — M879 Osteonecrosis, unspecified: Secondary | ICD-10-CM | POA: Diagnosis not present

## 2023-09-13 LAB — BASIC METABOLIC PANEL
Anion gap: 5 (ref 5–15)
BUN: 14 mg/dL (ref 6–20)
CO2: 24 mmol/L (ref 22–32)
Calcium: 8.6 mg/dL — ABNORMAL LOW (ref 8.9–10.3)
Chloride: 110 mmol/L (ref 98–111)
Creatinine, Ser: 1.05 mg/dL (ref 0.61–1.24)
GFR, Estimated: >60 mL/min (ref >60)
Glucose, Bld: 111 mg/dL — ABNORMAL HIGH (ref 70–99)
Potassium: 3.9 mmol/L (ref 3.5–5.1)
Sodium: 139 mmol/L (ref 135–145)

## 2023-09-13 LAB — CBC
HCT: 37.2 % — ABNORMAL LOW (ref 39.0–52.0)
Hemoglobin: 12.3 g/dL — ABNORMAL LOW (ref 13.0–17.0)
MCH: 29.7 pg (ref 26.0–34.0)
MCHC: 33.1 g/dL (ref 30.0–36.0)
MCV: 89.9 fL (ref 80.0–100.0)
Platelets: 255 10*3/uL (ref 150–400)
RBC: 4.14 MIL/uL — ABNORMAL LOW (ref 4.22–5.81)
RDW: 14 % (ref 11.5–15.5)
WBC: 15.4 10*3/uL — ABNORMAL HIGH (ref 4.0–10.5)
nRBC: 0 % (ref 0.0–0.2)

## 2023-09-13 LAB — SURGICAL PATHOLOGY

## 2023-09-13 NOTE — TOC Transition Note (Signed)
Transition of Care [facility] - Discharge Note   Patient Details  Name: Charles Garrison MRN: [id number] Date of Birth: 11-05-1970  Transition of Care [facility]) CM/SW Contact:  Garret Reddish, RN Phone Number: 09/13/2023, 1:00 PM   Clinical Narrative:    Chart reviewed.  Noted that patient will be a discharge for today.   I have informed patient that PT has recommended Home Health PT/OT.  Patient did not have a home health preference.  I have asked Velna Hatchet with [facility] to accept home health.  SOC will be Monday February 17 th 2025.    I have asked Jon with [employer] to provide patient with a BSC and Rolling walker.   I have informed staff nurse of above information.     Final next level of care: Home w Home Health Services Barriers to Discharge: No Barriers Identified   Patient Goals and CMS Choice   CMS Medicare.gov Compare Post Acute Care list provided to:: Patient Choice offered to / list presented to : Patient      Discharge Placement                    Patient and family notified of of transfer: 09/13/23  Discharge Plan and Services Additional resources added to the After Visit Summary for                  DME Arranged: Bedside commode, Walker rolling DME Agency: [employer] Date DME Agency Contacted: 09/13/23   Representative spoke with at DME Agency: [facility] HH Arranged: PT, OT [facility] Agency: [facility] Date [facility] Agency Contacted: 09/13/23   Representative spoke with at [facility] Agency: Velna Hatchet  Social Drivers of Health (SDOH) Interventions SDOH Screenings   Food Insecurity: No Food Insecurity (09/12/2023)  Housing: Low Risk  (09/12/2023)  Transportation Needs: No Transportation Needs (09/12/2023)  Utilities: Not At Risk (09/12/2023)  Tobacco Use: High Risk (09/12/2023)     Readmission Risk Interventions     No data to display

## 2023-09-13 NOTE — Progress Notes (Signed)
Patient is not able to walk the distance required to go the bathroom, or he is unable to safely negotiate stairs required to access the bathroom.  A 3in1 BSC will alleviate this problem.        Lollie Marrow, PA-C [facility]
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
## 2023-09-13 NOTE — Plan of Care (Signed)
  Problem: Safety: Goal: Ability to remain free from injury will improve Outcome: Progressing   Problem: Skin Integrity: Goal: Risk for impaired skin integrity will decrease Outcome: Progressing   Problem: Education: Goal: Knowledge of the prescribed therapeutic regimen will improve Outcome: Progressing Goal: Understanding of discharge needs will improve Outcome: Progressing Goal: Individualized Educational Video(s) Outcome: Progressing   Problem: Activity: Goal: Ability to avoid complications of mobility impairment will improve Outcome: Progressing

## 2023-09-13 NOTE — Progress Notes (Signed)
Physical Therapy Treatment Patient Details Name: Charles Garrison MRN: [id number] DOB: 04/21/1971 Today's Date: 09/13/2023   History of Present Illness Patient is a 53 year old male with right hip avascular necrosis s/p right total hip arthroplasty.    PT Comments  Modified independent performance of all mobility, >221ft AMB c RW, 8 stairs performed with railings. Pt reports readiness for DC to home today. Waiting on DME for DC.     If plan is discharge home, recommend the following: Help with stairs or ramp for entrance;Assist for transportation   Can travel by private vehicle        Equipment Recommendations  Rolling walker (2 wheels);BSC/3in1    Recommendations for Other Services       Precautions / Restrictions Precautions Precautions: None Precaution Booklet Issued: Yes (comment) Restrictions RLE Weight Bearing Per Provider Order: Weight bearing as tolerated     Mobility  Bed Mobility Overal bed mobility: Modified Independent Bed Mobility: Supine to Sit     Supine to sit: Modified independent (Device/Increase time)          Transfers Overall transfer level: Needs assistance Equipment used: Rolling walker (2 wheels) Transfers: Sit to/from Stand Sit to Stand: Supervision                Ambulation/Gait Ambulation/Gait assistance: Contact guard assist Gait Distance (Feet): 280 Feet Assistive device: Rolling walker (2 wheels) Gait Pattern/deviations: Step-to pattern Gait velocity: 0.81m/s         Stairs Stairs: Yes   Stair Management: Two rails Number of Stairs: 8     Wheelchair Mobility     Tilt Bed    Modified Rankin (Stroke Patients Only)       Balance                                            Communication    Cognition                                        Cueing    Exercises      General Comments        Pertinent Vitals/Pain Pain Assessment Pain Assessment: 0-10 Pain Score: 7   Pain Location: R hip Pain Descriptors / Indicators: Sore Pain Intervention(s): Limited activity within patient's tolerance    Home Living                          Prior Function            PT Goals (current goals can now be found in the care plan section) Acute Rehab PT Goals Patient Stated Goal: to go home tomorrow PT Goal Formulation: With patient Time For Goal Achievement: 09/26/23 Potential to Achieve Goals: Good Progress towards PT goals: Progressing toward goals    Frequency    BID      PT Plan      Co-evaluation              AM-PAC PT "6 Clicks" Mobility   Outcome Measure  Help needed turning from your back to your side while in a flat bed without using bedrails?: A Little Help needed moving from lying on your back to sitting on the side of a flat bed without using  bedrails?: A Little Help needed moving to and from a bed to a chair (including a wheelchair)?: A Little Help needed standing up from a chair using your arms (e.g., wheelchair or bedside chair)?: A Little Help needed to walk in hospital room?: A Little Help needed climbing 3-5 steps with a railing? : A Little 6 Click Score: 18    End of Session Equipment Utilized During Treatment: Gait belt Activity Tolerance: Patient tolerated treatment well;No increased pain Patient left: with call bell/phone within reach;with family/visitor present;in chair Nurse Communication: Mobility status PT Visit Diagnosis: Difficulty in walking, not elsewhere classified (R26.2);Other abnormalities of gait and mobility (R26.89)     Time: 8657-8469 PT Time Calculation (min) (ACUTE ONLY): 23 min  Charges:    $Gait Training: 8-22 mins $Therapeutic Activity: 8-22 mins PT General Charges $$ ACUTE PT VISIT: 1 Visit                    12:49 PM, 09/13/23 Rosamaria Lints, PT, DPT Physical Therapist - [facility]  [phone] (ASCOM)     [name] 09/13/2023,  12:47 PM
